# Patient Record
Sex: Female | Born: 1965 | Race: White | Hispanic: No | Marital: Married | Smoking: Never smoker
Health system: Southern US, Community
[De-identification: ages and names within clinical notes are randomized; demographics above are authoritative.]

## PROBLEM LIST (undated history)

## (undated) DIAGNOSIS — I219 Acute myocardial infarction, unspecified: Secondary | ICD-10-CM

## (undated) DIAGNOSIS — R0600 Dyspnea, unspecified: Secondary | ICD-10-CM

## (undated) DIAGNOSIS — A812 Progressive multifocal leukoencephalopathy: Secondary | ICD-10-CM

## (undated) DIAGNOSIS — G40209 Localization-related (focal) (partial) symptomatic epilepsy and epileptic syndromes with complex partial seizures, not intractable, without status epilepticus: Secondary | ICD-10-CM

## (undated) DIAGNOSIS — G709 Myoneural disorder, unspecified: Secondary | ICD-10-CM

## (undated) DIAGNOSIS — G43909 Migraine, unspecified, not intractable, without status migrainosus: Secondary | ICD-10-CM

## (undated) DIAGNOSIS — D169 Benign neoplasm of bone and articular cartilage, unspecified: Secondary | ICD-10-CM

## (undated) DIAGNOSIS — E559 Vitamin D deficiency, unspecified: Secondary | ICD-10-CM

## (undated) DIAGNOSIS — G4730 Sleep apnea, unspecified: Secondary | ICD-10-CM

## (undated) DIAGNOSIS — I10 Essential (primary) hypertension: Secondary | ICD-10-CM

## (undated) DIAGNOSIS — E611 Iron deficiency: Secondary | ICD-10-CM

## (undated) DIAGNOSIS — I639 Cerebral infarction, unspecified: Secondary | ICD-10-CM

## (undated) DIAGNOSIS — R0609 Other forms of dyspnea: Secondary | ICD-10-CM

## (undated) DIAGNOSIS — J449 Chronic obstructive pulmonary disease, unspecified: Secondary | ICD-10-CM

## (undated) DIAGNOSIS — IMO0001 Reserved for inherently not codable concepts without codable children: Secondary | ICD-10-CM

## (undated) DIAGNOSIS — E519 Thiamine deficiency, unspecified: Secondary | ICD-10-CM

## (undated) DIAGNOSIS — J45909 Unspecified asthma, uncomplicated: Secondary | ICD-10-CM

## (undated) DIAGNOSIS — R011 Cardiac murmur, unspecified: Secondary | ICD-10-CM

## (undated) HISTORY — DX: Thiamine deficiency, unspecified: E51.9

## (undated) HISTORY — DX: Dyspnea, unspecified: R06.00

## (undated) HISTORY — DX: Unspecified asthma, uncomplicated: J45.909

## (undated) HISTORY — PX: MUSCLE BIOPSY: SHX716

## (undated) HISTORY — PX: LOOP RECORDER IMPLANT: SHX5954

## (undated) HISTORY — DX: Other forms of dyspnea: R06.09

## (undated) HISTORY — DX: Chronic obstructive pulmonary disease, unspecified: J44.9

## (undated) HISTORY — DX: Cardiac murmur, unspecified: R01.1

## (undated) HISTORY — DX: Essential (primary) hypertension: I10

## (undated) HISTORY — PX: CRANIOTOMY: SHX93

## (undated) HISTORY — DX: Vitamin D deficiency, unspecified: E55.9

## (undated) HISTORY — DX: Progressive multifocal leukoencephalopathy: A81.2

## (undated) HISTORY — DX: Sleep apnea, unspecified: G47.30

## (undated) HISTORY — PX: OTHER SURGICAL HISTORY: SHX169

## (undated) HISTORY — DX: Cerebral infarction, unspecified: I63.9

## (undated) HISTORY — PX: OSTEOTOMY: SHX137

## (undated) HISTORY — PX: TUBAL LIGATION: SHX77

## (undated) HISTORY — DX: Myoneural disorder, unspecified: G70.9

## (undated) HISTORY — DX: Localization-related (focal) (partial) symptomatic epilepsy and epileptic syndromes with complex partial seizures, not intractable, without status epilepticus: G40.209

---

## 2000-11-26 ENCOUNTER — Other Ambulatory Visit: Admission: RE | Admit: 2000-11-26 | Discharge: 2000-11-26 | Payer: Self-pay | Admitting: Family Medicine

## 2002-02-16 ENCOUNTER — Emergency Department (HOSPITAL_COMMUNITY): Admission: EM | Admit: 2002-02-16 | Discharge: 2002-02-16 | Payer: Self-pay | Admitting: *Deleted

## 2002-02-18 ENCOUNTER — Emergency Department (HOSPITAL_COMMUNITY): Admission: EM | Admit: 2002-02-18 | Discharge: 2002-02-18 | Payer: Self-pay | Admitting: Emergency Medicine

## 2012-10-17 ENCOUNTER — Telehealth: Payer: Self-pay | Admitting: Nurse Practitioner

## 2012-10-17 NOTE — Telephone Encounter (Signed)
APPT MADE

## 2012-10-19 ENCOUNTER — Encounter: Payer: Self-pay | Admitting: General Practice

## 2012-10-19 ENCOUNTER — Ambulatory Visit (INDEPENDENT_AMBULATORY_CARE_PROVIDER_SITE_OTHER): Payer: PRIVATE HEALTH INSURANCE | Admitting: General Practice

## 2012-10-19 VITALS — BP 143/92 | HR 75 | Temp 98.6°F | Ht 67.0 in | Wt 142.0 lb

## 2012-10-19 DIAGNOSIS — N6320 Unspecified lump in the left breast, unspecified quadrant: Secondary | ICD-10-CM

## 2012-10-19 DIAGNOSIS — N63 Unspecified lump in unspecified breast: Secondary | ICD-10-CM

## 2012-10-19 NOTE — Patient Instructions (Addendum)
Mammogram Tips  Healthy women should begin getting mammograms every year or two once they reach age 47, and once a year when they reach age 50. Here are tips:  · Find an experienced, high-volume center with accomplished radiologists. You can ask for their credentials.  · Ask to see the certificate showing the center is approved by the U.S. Food and Drug Administration.  · Use the same center regularly, so it is easier to compare your new mammograms with your old ones.  · Bring a list of places you have had mammograms, dates, biopsies or other breast treatments. Bring old mammograms with you or have them sent to your primary caregiver.  · Describe any breast problems to your caregiver or the person doing the mammogram. Be ready to give past surgeries, birth control pills, hormone use, breast implants, growths, moles, breast scars and family or personal history of breast cancer.  · Call your doctor or center to check on the mammogram if you hear nothing within 10 days. Do not assume everything was normal.  · To protect your privacy, the mammogram results cannot be given over the phone or to anyone but you.  · Radiation from a mammogram is very low and does not pose a radiation risk.  · Mammograms can detect breast problems other than breast cancer.  · You may be asked stand or sit in front of the X-ray machine.  · Two small plastic or glass plates are placed around the breast when taking the X-ray.  · If you are menstruating, schedule your mammogram a week after your menstrual period.  · Do not wear deodorants, powder or perfume when getting a mammogram.  · Wash your breasts and under your arms before getting a mammogram.  · Wear cloths that are easy for you to undress and dress.  · Arrive at the center at least 15 minutes before the mammogram is scheduled.  · There may be slight discomfort during the mammogram, but it goes away shortly after the test.  · Try to relax as much as possible during the mammogram.  · Talk  to your caregiver if you do not understand the results of the mammogram.  · Follow the recommendations of your caregiver regarding further tests and treatments if needed.  · Get a second opinion if you are concerned or question the results of the mammogram, further tests or treatment if needed.  · Continue with monthly self-breast exams and yearly caregiver exams even if the mammogram is normal.  · Your caregiver may recommend getting a mammogram before age 47 and more often if you are at high risk for developing breast cancer.  Document Released: 10/22/2005 Document Revised: 09/28/2011 Document Reviewed: 06/29/2008  [employer]® Patient Information ©2013 [employer].

## 2012-10-19 NOTE — Progress Notes (Signed)
  Subjective:    Patient ID: Deborah Meyer, female    DOB: 02-Mar-1966, 47 y.o.   MRN: [id number]  HPI Presents with left breast lump, which was detected by patient last week. Reports finding one lump in the past and it was removed. Reports lump is firm, tender, and denies drainage. Reports it to be about size of quarter. Reports having a lump removed from same breast 4 years ago. Denies having yearly mammograms, last mammogram more than 2 years ago.     Review of Systems  Constitutional: Negative for chills and unexpected weight change.  HENT: Negative for neck pain.        Left neck nodule   Respiratory: Negative for chest tightness and shortness of breath.   Cardiovascular: Negative for chest pain and palpitations.  Genitourinary: Negative for difficulty urinating.  Skin: Negative.  Negative for rash.  Neurological: Negative for dizziness and headaches.  Psychiatric/Behavioral: Negative.        Objective:   Physical Exam  Constitutional: She is oriented to person, place, and time. She appears well-developed and well-nourished.  Neck: Normal range of motion. No thyromegaly present.  lleft anterior cervical chain edema, tenderness  Cardiovascular: Normal rate, regular rhythm and normal heart sounds.   No murmur heard. Pulmonary/Chest: Effort normal and breath sounds normal. No respiratory distress. She exhibits no tenderness and no edema. Right breast exhibits no inverted nipple, no mass, no nipple discharge, no skin change and no tenderness. Left breast exhibits mass. Left breast exhibits no inverted nipple, no nipple discharge, no skin change and no tenderness. Breasts are symmetrical.  Left upper outer breast has non-tender lump, about size of quarter, slightly mobility.  Shape, size, symmetry, and color of bilateral breast are equal.   Lymphadenopathy:    She has cervical adenopathy.  Neurological: She is alert and oriented to person, place, and time.  Skin: Skin is warm and  dry. No rash noted.         Assessment & Plan:  Referral to [facility] for diagnostic mammogram  Continue self breast examinations Discussed importance of yearly mammograms RTO if symptoms worsen prior to referral  Patient verbalized understanding and denies further questions  Raymon Mutton, FNP-C

## 2012-10-25 ENCOUNTER — Encounter: Payer: Self-pay | Admitting: General Practice

## 2012-10-26 ENCOUNTER — Telehealth: Payer: Self-pay

## 2012-10-26 ENCOUNTER — Telehealth: Payer: Self-pay | Admitting: General Practice

## 2012-10-26 DIAGNOSIS — N63 Unspecified lump in unspecified breast: Secondary | ICD-10-CM

## 2012-10-26 NOTE — Telephone Encounter (Signed)
Attempted to contact patient on all 3 contact numbers listed. Left message for patient to call office.

## 2012-10-27 ENCOUNTER — Telehealth: Payer: Self-pay | Admitting: General Practice

## 2012-10-27 NOTE — Telephone Encounter (Signed)
Has no prefrence just someone around the [place]

## 2012-10-28 ENCOUNTER — Other Ambulatory Visit: Payer: Self-pay | Admitting: *Deleted

## 2012-10-28 DIAGNOSIS — N63 Unspecified lump in unspecified breast: Secondary | ICD-10-CM

## 2012-10-28 NOTE — Progress Notes (Signed)
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  Radiologist suggested 6 month f/u with L breast ultrasound.  Referral made and will be deferred for 6 months.

## 2012-10-31 ENCOUNTER — Other Ambulatory Visit: Payer: Self-pay

## 2012-10-31 ENCOUNTER — Encounter: Payer: Self-pay | Admitting: Family Medicine

## 2012-10-31 ENCOUNTER — Telehealth: Payer: Self-pay

## 2012-10-31 DIAGNOSIS — N6320 Unspecified lump in the left breast, unspecified quadrant: Secondary | ICD-10-CM

## 2012-10-31 NOTE — Telephone Encounter (Signed)
Patient calling to see when surgeon is scheduled to remove left breast lump    There is no referral for a surgeon in workqueue. Please put a referral in for surgeon  Thanks

## 2012-11-01 NOTE — Telephone Encounter (Signed)
Referral request made

## 2012-11-01 NOTE — Telephone Encounter (Signed)
REFERRAL MADE

## 2012-11-01 NOTE — Telephone Encounter (Signed)
Referral order placed today.

## 2013-05-18 ENCOUNTER — Telehealth: Payer: Self-pay | Admitting: *Deleted

## 2013-05-25 ENCOUNTER — Other Ambulatory Visit: Payer: Self-pay

## 2013-06-19 NOTE — Telephone Encounter (Signed)
taken care of-

## 2014-01-25 ENCOUNTER — Other Ambulatory Visit: Payer: Self-pay

## 2014-02-05 ENCOUNTER — Encounter: Payer: Self-pay | Admitting: Family Medicine

## 2014-02-05 ENCOUNTER — Ambulatory Visit (INDEPENDENT_AMBULATORY_CARE_PROVIDER_SITE_OTHER): Payer: PRIVATE HEALTH INSURANCE | Admitting: Family Medicine

## 2014-02-05 VITALS — BP 161/89 | HR 88 | Temp 99.0°F | Ht 67.0 in | Wt 154.0 lb

## 2014-02-05 DIAGNOSIS — I10 Essential (primary) hypertension: Secondary | ICD-10-CM | POA: Insufficient documentation

## 2014-02-05 DIAGNOSIS — R011 Cardiac murmur, unspecified: Secondary | ICD-10-CM | POA: Insufficient documentation

## 2014-02-05 DIAGNOSIS — R0789 Other chest pain: Secondary | ICD-10-CM

## 2014-02-05 DIAGNOSIS — J449 Chronic obstructive pulmonary disease, unspecified: Secondary | ICD-10-CM | POA: Insufficient documentation

## 2014-02-05 DIAGNOSIS — G709 Myoneural disorder, unspecified: Secondary | ICD-10-CM | POA: Insufficient documentation

## 2014-02-05 LAB — POCT URINALYSIS DIPSTICK
Bilirubin, UA: NEGATIVE
Glucose, UA: NEGATIVE
KETONES UA: NEGATIVE
Nitrite, UA: NEGATIVE
PH UA: 5
SPEC GRAV UA: 1.025
UROBILINOGEN UA: NEGATIVE

## 2014-02-05 LAB — POCT UA - MICROSCOPIC ONLY
CASTS, UR, LPF, POC: NEGATIVE
Crystals, Ur, HPF, POC: NEGATIVE
Mucus, UA: NEGATIVE
Yeast, UA: NEGATIVE

## 2014-02-05 MED ORDER — NADOLOL 40 MG PO TABS: 40.0000 mg | ORAL_TABLET | Freq: Every day | ORAL | Status: DC

## 2014-02-05 NOTE — Progress Notes (Signed)
   Subjective:    Patient ID: Deborah Meyer, female    DOB: 11-13-1965, 48 y.o.   MRN: [id number]  Hypertension This is a chronic problem. The current episode started 1 to 4 weeks ago. The problem is unchanged. The problem is uncontrolled. Associated symptoms include chest pain (At times related to elevated BP). There are no associated agents to hypertension. Past treatments include angiotensin blockers and beta blockers. The current treatment provides no improvement. There are no compliance problems.  Hypertensive end-organ damage includes CAD/MI (possible per EKG).      Review of Systems  Constitutional: Negative.   HENT: Negative.   Eyes: Negative.   Respiratory: Negative.   Cardiovascular: Positive for chest pain (At times related to elevated BP).  Gastrointestinal: Negative.   Genitourinary: Negative.   Neurological: Negative.   Psychiatric/Behavioral: Negative.        Objective:   Physical Exam  Constitutional: She is oriented to person, place, and time. She appears well-developed and well-nourished.  Eyes: Conjunctivae and EOM are normal.  Neck: Normal range of motion. Neck supple.  Cardiovascular: Normal rate, regular rhythm and normal heart sounds.   Pulmonary/Chest: Effort normal and breath sounds normal.  Abdominal: Soft. Bowel sounds are normal.  No bruit heard  Musculoskeletal: Normal range of motion.  Neurological: She is alert and oriented to person, place, and time. She has normal reflexes.  Skin: Skin is warm and dry.  Psychiatric: She has a normal mood and affect. Her behavior is normal. Thought content normal.          Assessment & Plan:  1. Essential hypertension Trying to ID reason why BP has gone up For now increase nadolol from 20 to 40 mg - POCT urinalysis dipstick - EKG 12-Lead - BMP8+EGFR 2. EKG is not normal, exhibiting poor R wave progression.  She sees a cardiologist already; will get her back to see him

## 2014-02-06 ENCOUNTER — Telehealth: Payer: Self-pay | Admitting: Family Medicine

## 2014-02-06 LAB — BMP8+EGFR
BUN / CREAT RATIO: 22 (ref 9–23)
BUN: 15 mg/dL (ref 6–24)
CHLORIDE: 104 mmol/L (ref 97–108)
CO2: 24 mmol/L (ref 18–29)
Calcium: 9.2 mg/dL (ref 8.7–10.2)
Creatinine, Ser: 0.68 mg/dL (ref 0.57–1.00)
GFR, EST AFRICAN AMERICAN: 120 mL/min/{1.73_m2} (ref >59)
GFR, EST NON AFRICAN AMERICAN: 104 mL/min/{1.73_m2} (ref >59)
Glucose: 86 mg/dL (ref 65–99)
POTASSIUM: 3.9 mmol/L (ref 3.5–5.2)
SODIUM: 140 mmol/L (ref 134–144)

## 2014-02-06 NOTE — Telephone Encounter (Signed)
Patient aware of labs and checking on referral it says letter sent?

## 2014-02-17 DIAGNOSIS — IMO0001 Reserved for inherently not codable concepts without codable children: Secondary | ICD-10-CM

## 2014-02-17 HISTORY — DX: Reserved for inherently not codable concepts without codable children: IMO0001

## 2014-02-21 ENCOUNTER — Telehealth: Payer: Self-pay | Admitting: Family Medicine

## 2014-02-21 NOTE — Telephone Encounter (Signed)
Did you call on referral?

## 2014-03-05 NOTE — Progress Notes (Signed)
Just a  FYI: We have tried to call the patient to come in for a urine culture and have been unable to reach after serial tries

## 2014-05-04 ENCOUNTER — Other Ambulatory Visit: Payer: Self-pay

## 2014-05-25 ENCOUNTER — Observation Stay (HOSPITAL_COMMUNITY)
Admission: EM | Admit: 2014-05-25 | Discharge: 2014-05-26 | Disposition: A | Discharge status: Home / Self Care | Payer: PRIVATE HEALTH INSURANCE | Attending: Internal Medicine | Admitting: Internal Medicine

## 2014-05-25 ENCOUNTER — Emergency Department (HOSPITAL_COMMUNITY): Payer: PRIVATE HEALTH INSURANCE

## 2014-05-25 ENCOUNTER — Encounter (HOSPITAL_COMMUNITY): Payer: Self-pay

## 2014-05-25 DIAGNOSIS — R200 Anesthesia of skin: Secondary | ICD-10-CM | POA: Diagnosis not present

## 2014-05-25 DIAGNOSIS — Z79899 Other long term (current) drug therapy: Secondary | ICD-10-CM | POA: Insufficient documentation

## 2014-05-25 DIAGNOSIS — I4581 Long QT syndrome: Secondary | ICD-10-CM | POA: Insufficient documentation

## 2014-05-25 DIAGNOSIS — G709 Myoneural disorder, unspecified: Secondary | ICD-10-CM | POA: Diagnosis not present

## 2014-05-25 DIAGNOSIS — Z880 Allergy status to penicillin: Secondary | ICD-10-CM | POA: Diagnosis not present

## 2014-05-25 DIAGNOSIS — Z7951 Long term (current) use of inhaled steroids: Secondary | ICD-10-CM | POA: Insufficient documentation

## 2014-05-25 DIAGNOSIS — W19XXXA Unspecified fall, initial encounter: Secondary | ICD-10-CM | POA: Insufficient documentation

## 2014-05-25 DIAGNOSIS — Y9289 Other specified places as the place of occurrence of the external cause: Secondary | ICD-10-CM | POA: Insufficient documentation

## 2014-05-25 DIAGNOSIS — R011 Cardiac murmur, unspecified: Secondary | ICD-10-CM | POA: Insufficient documentation

## 2014-05-25 DIAGNOSIS — S0990XA Unspecified injury of head, initial encounter: Secondary | ICD-10-CM | POA: Diagnosis not present

## 2014-05-25 DIAGNOSIS — W01198A Fall on same level from slipping, tripping and stumbling with subsequent striking against other object, initial encounter: Secondary | ICD-10-CM | POA: Diagnosis not present

## 2014-05-25 DIAGNOSIS — Y9389 Activity, other specified: Secondary | ICD-10-CM | POA: Diagnosis not present

## 2014-05-25 DIAGNOSIS — R55 Syncope and collapse: Principal | ICD-10-CM

## 2014-05-25 DIAGNOSIS — R4020 Unspecified coma: Secondary | ICD-10-CM | POA: Diagnosis present

## 2014-05-25 DIAGNOSIS — R112 Nausea with vomiting, unspecified: Secondary | ICD-10-CM | POA: Insufficient documentation

## 2014-05-25 DIAGNOSIS — I10 Essential (primary) hypertension: Secondary | ICD-10-CM | POA: Diagnosis not present

## 2014-05-25 DIAGNOSIS — I213 ST elevation (STEMI) myocardial infarction of unspecified site: Secondary | ICD-10-CM | POA: Insufficient documentation

## 2014-05-25 DIAGNOSIS — Z7952 Long term (current) use of systemic steroids: Secondary | ICD-10-CM | POA: Insufficient documentation

## 2014-05-25 DIAGNOSIS — R9431 Abnormal electrocardiogram [ECG] [EKG]: Secondary | ICD-10-CM | POA: Diagnosis present

## 2014-05-25 DIAGNOSIS — J45909 Unspecified asthma, uncomplicated: Secondary | ICD-10-CM | POA: Diagnosis not present

## 2014-05-25 DIAGNOSIS — E876 Hypokalemia: Secondary | ICD-10-CM | POA: Diagnosis present

## 2014-05-25 HISTORY — DX: Benign neoplasm of bone and articular cartilage, unspecified: D16.9

## 2014-05-25 HISTORY — DX: Acute myocardial infarction, unspecified: I21.9

## 2014-05-25 LAB — CBC WITH DIFFERENTIAL/PLATELET
Basophils Absolute: 0 10*3/uL (ref 0.0–0.1)
Basophils Relative: 1 % (ref 0–1)
EOS ABS: 0.1 10*3/uL (ref 0.0–0.7)
Eosinophils Relative: 1 % (ref 0–5)
HCT: 38.1 % (ref 36.0–46.0)
HEMOGLOBIN: 12.6 g/dL (ref 12.0–15.0)
LYMPHS ABS: 1.1 10*3/uL (ref 0.7–4.0)
Lymphocytes Relative: 21 % (ref 12–46)
MCH: 30.2 pg (ref 26.0–34.0)
MCHC: 33.1 g/dL (ref 30.0–36.0)
MCV: 91.4 fL (ref 78.0–100.0)
MONO ABS: 0.5 10*3/uL (ref 0.1–1.0)
MONOS PCT: 9 % (ref 3–12)
NEUTROS PCT: 68 % (ref 43–77)
Neutro Abs: 3.7 10*3/uL (ref 1.7–7.7)
Platelets: 184 10*3/uL (ref 150–400)
RBC: 4.17 MIL/uL (ref 3.87–5.11)
RDW: 13.4 % (ref 11.5–15.5)
WBC: 5.4 10*3/uL (ref 4.0–10.5)

## 2014-05-25 LAB — BASIC METABOLIC PANEL
Anion gap: 12 (ref 5–15)
BUN: 14 mg/dL (ref 6–23)
CO2: 24 mEq/L (ref 19–32)
CREATININE: 0.7 mg/dL (ref 0.50–1.10)
Calcium: 8.8 mg/dL (ref 8.4–10.5)
Chloride: 105 mEq/L (ref 96–112)
GFR calc Af Amer: >90 mL/min (ref >90)
GFR calc non Af Amer: >90 mL/min (ref >90)
GLUCOSE: 111 mg/dL — AB (ref 70–99)
POTASSIUM: 3.4 meq/L — AB (ref 3.7–5.3)
Sodium: 141 mEq/L (ref 137–147)

## 2014-05-25 LAB — URINALYSIS, ROUTINE W REFLEX MICROSCOPIC
BILIRUBIN URINE: NEGATIVE
Glucose, UA: NEGATIVE mg/dL
Ketones, ur: NEGATIVE mg/dL
Nitrite: NEGATIVE
Protein, ur: NEGATIVE mg/dL
SPECIFIC GRAVITY, URINE: 1.02 (ref 1.005–1.030)
Urobilinogen, UA: 0.2 mg/dL (ref 0.0–1.0)
pH: 6.5 (ref 5.0–8.0)

## 2014-05-25 LAB — URINE MICROSCOPIC-ADD ON

## 2014-05-25 LAB — PREGNANCY, URINE: PREG TEST UR: NEGATIVE

## 2014-05-25 LAB — CBG MONITORING, ED: GLUCOSE-CAPILLARY: 101 mg/dL — AB (ref 70–99)

## 2014-05-25 LAB — TROPONIN I: Troponin I: <0.3 ng/mL (ref <0.30)

## 2014-05-25 LAB — MAGNESIUM: Magnesium: 2.1 mg/dL (ref 1.5–2.5)

## 2014-05-25 MED ORDER — ALBUTEROL SULFATE (2.5 MG/3ML) 0.083% IN NEBU: 3.0000 mL | INHALATION_SOLUTION | Freq: Four times a day (QID) | RESPIRATORY_TRACT | Status: DC | PRN

## 2014-05-25 MED ORDER — POTASSIUM CHLORIDE CRYS ER 20 MEQ PO TBCR
40.0000 meq | EXTENDED_RELEASE_TABLET | Freq: Once | ORAL | Status: AC
Start: 2014-05-25 — End: 2014-05-25
  Administered 2014-05-25: 40 meq via ORAL
  Filled 2014-05-25: qty 2

## 2014-05-25 MED ORDER — ENOXAPARIN SODIUM 40 MG/0.4ML ~~LOC~~ SOLN
40.0000 mg | SUBCUTANEOUS | Status: DC
  Administered 2014-05-25: 40 mg via SUBCUTANEOUS
  Filled 2014-05-25: qty 0.4

## 2014-05-25 MED ORDER — MONTELUKAST SODIUM 10 MG PO TABS
10.0000 mg | ORAL_TABLET | Freq: Every day | ORAL | Status: DC
  Administered 2014-05-25: 10 mg via ORAL
  Filled 2014-05-25: qty 1

## 2014-05-25 MED ORDER — ALUM & MAG HYDROXIDE-SIMETH 200-200-20 MG/5ML PO SUSP: 30.0000 mL | Freq: Four times a day (QID) | ORAL | Status: DC | PRN

## 2014-05-25 MED ORDER — HYDROCODONE-ACETAMINOPHEN 5-325 MG PO TABS
1.0000 | ORAL_TABLET | Freq: Four times a day (QID) | ORAL | Status: DC | PRN
  Administered 2014-05-25: 1 via ORAL
  Filled 2014-05-25: qty 1

## 2014-05-25 MED ORDER — MOMETASONE FURO-FORMOTEROL FUM 100-5 MCG/ACT IN AERO
2.0000 | INHALATION_SPRAY | Freq: Two times a day (BID) | RESPIRATORY_TRACT | Status: DC
  Administered 2014-05-25 – 2014-05-26 (×2): 2 via RESPIRATORY_TRACT
  Filled 2014-05-25: qty 8.8

## 2014-05-25 MED ORDER — ONDANSETRON HCL 4 MG PO TABS: 4.0000 mg | ORAL_TABLET | Freq: Four times a day (QID) | ORAL | Status: DC | PRN

## 2014-05-25 MED ORDER — ONDANSETRON HCL 4 MG/2ML IJ SOLN
4.0000 mg | Freq: Once | INTRAMUSCULAR | Status: AC
  Administered 2014-05-25: 4 mg via INTRAVENOUS
  Filled 2014-05-25: qty 2

## 2014-05-25 MED ORDER — AMLODIPINE BESYLATE 5 MG PO TABS
10.0000 mg | ORAL_TABLET | Freq: Every day | ORAL | Status: DC
  Administered 2014-05-25 – 2014-05-26 (×2): 10 mg via ORAL
  Filled 2014-05-25 (×2): qty 2

## 2014-05-25 MED ORDER — SODIUM CHLORIDE 0.9 % IV BOLUS (SEPSIS)
1000.0000 mL | Freq: Once | INTRAVENOUS | Status: AC
  Administered 2014-05-25: 1000 mL via INTRAVENOUS

## 2014-05-25 MED ORDER — ACETAMINOPHEN 325 MG PO TABS
650.0000 mg | ORAL_TABLET | Freq: Four times a day (QID) | ORAL | Status: DC | PRN
  Administered 2014-05-26: 650 mg via ORAL
  Filled 2014-05-25: qty 2

## 2014-05-25 MED ORDER — SODIUM CHLORIDE 0.9 % IV SOLN: 250.0000 mL | INTRAVENOUS | Status: DC | PRN

## 2014-05-25 MED ORDER — ONDANSETRON HCL 4 MG/2ML IJ SOLN: 4.0000 mg | Freq: Four times a day (QID) | INTRAMUSCULAR | Status: DC | PRN

## 2014-05-25 MED ORDER — ACETAMINOPHEN 650 MG RE SUPP: 650.0000 mg | Freq: Four times a day (QID) | RECTAL | Status: DC | PRN

## 2014-05-25 MED ORDER — BISACODYL 10 MG RE SUPP: 10.0000 mg | Freq: Every day | RECTAL | Status: DC | PRN

## 2014-05-25 MED ORDER — CLONAZEPAM 0.5 MG PO TABS: 1.0000 mg | ORAL_TABLET | Freq: Two times a day (BID) | ORAL | Status: DC | PRN

## 2014-05-25 MED ORDER — SODIUM CHLORIDE 0.9 % IJ SOLN: 3.0000 mL | INTRAMUSCULAR | Status: DC | PRN

## 2014-05-25 MED ORDER — SODIUM CHLORIDE 0.9 % IJ SOLN
3.0000 mL | Freq: Two times a day (BID) | INTRAMUSCULAR | Status: DC
  Administered 2014-05-25 (×2): 3 mL via INTRAVENOUS

## 2014-05-25 MED ORDER — LISINOPRIL 10 MG PO TABS
10.0000 mg | ORAL_TABLET | Freq: Every day | ORAL | Status: DC
  Administered 2014-05-25 – 2014-05-26 (×2): 10 mg via ORAL
  Filled 2014-05-25 (×2): qty 1

## 2014-05-25 MED ORDER — SODIUM CHLORIDE 0.9 % IJ SOLN: 3.0000 mL | Freq: Two times a day (BID) | INTRAMUSCULAR | Status: DC

## 2014-05-25 MED ORDER — SODIUM CHLORIDE 0.9 % IJ SOLN
INTRAMUSCULAR | Status: AC
  Filled 2014-05-25: qty 30

## 2014-05-25 NOTE — ED Provider Notes (Signed)
Patient had syncopal event this morning. She felt lightheaded immediately prior to the event. She fell striking her head. Presently complains of mild headache and "numbness" at the right side of her face no other associated symptoms. On exam patient alert Glasgow Coma Score 15 HEENT exam no facial asymmetry neck supple no bruit lungs clear auscultation heart regular rate and rhythm abdomen nontender. Service of edema neurologic Glasgow Coma Score 15 cranial nerves II through XII grossly intact pronator drift normal finger to nose normal heel-to-shin normal DTR symmetric bilaterally knee jerk ankle jerk and biceps toes downward going bilaterally. MRI brainordered due to patient striking her head and also abnormal feeling in the right side of her face. Patient has long QT interval on EKG and no old EKG for comparison. I don't have obvious etiology of her syncope, therefore suggests inpatient observation  Orlie Dakin, MD 05/25/14 [phone]

## 2014-05-25 NOTE — ED Provider Notes (Signed)
CSN: [id number]     Arrival date & time 05/25/14  0751 History   First MD Initiated Contact with Patient 05/25/14 [phone]     Chief Complaint  Patient presents with  . Loss of Consciousness     (Consider location/radiation/quality/duration/timing/severity/associated sxs/prior Treatment) HPI  Past Medical History  Diagnosis Date  . Hypertension   . Asthma   . Neuromuscular disorder   . Heart murmur   . Heart attack    Past Surgical History  Procedure Laterality Date  . Tubal ligation    . Osteotomy    . Muscle biopsy    . Craniotomy    . Breast tumors removed     Family History  Problem Relation Age of Onset  . Diabetes Mother   . Hypertension Mother   . Stroke Mother   . Heart disease Mother   . Heart disease Father   . Stroke Father   . Depression Father   . Alzheimer's disease Father   . Diabetes Sister   . Diabetes Brother   . Hyperlipidemia Brother    History  Substance Use Topics  . Smoking status: Never Smoker   . Smokeless tobacco: Not on file  . Alcohol Use: No   OB History    No data available     Review of Systems    Allergies  Mushroom extract complex; Dairy aid; Lyrica; Morphine and related; Penicillins; Toradol; Asa; Prednisone; and Tramadol  Home Medications   Prior to Admission medications   Medication Sig Start Date End Date Taking? Authorizing Provider  albuterol (PROVENTIL HFA;VENTOLIN HFA) 108 (90 BASE) MCG/ACT inhaler Inhale 2 puffs into the lungs every 6 (six) hours as needed for wheezing.    Historical Provider, MD  aliskiren (TEKTURNA) 150 MG tablet Take 150 mg by mouth daily.    Historical Provider, MD  Armodafinil (NUVIGIL) 250 MG tablet Take 250 mg by mouth daily.    Historical Provider, MD  clonazePAM (KLONOPIN) 1 MG tablet Take 1 mg by mouth 2 (two) times daily as needed for anxiety.    Historical Provider, MD  cyanocobalamin (,VITAMIN B-12,) 1000 MCG/ML injection Inject 1,000 mcg into the muscle once a week.    Historical  Provider, MD  fluticasone-salmeterol (ADVAIR HFA) 115-21 MCG/ACT inhaler Inhale 2 puffs into the lungs 2 (two) times daily.    Historical Provider, MD  hydrochlorothiazide (HYDRODIURIL) 25 MG tablet Take 25 mg by mouth daily.    Historical Provider, MD  HYDROcodone-acetaminophen (NORCO/VICODIN) 5-325 MG per tablet Take 1 tablet by mouth every 6 (six) hours as needed for pain.    Historical Provider, MD  methylPREDNISolone acetate (DEPO-MEDROL) 20 MG/ML SUSP injection Inject 10 mg into the muscle every 3 (three) days.    Historical Provider, MD  mometasone (NASONEX) 50 MCG/ACT nasal spray Place 2 sprays into the nose daily.    Historical Provider, MD  montelukast (SINGULAIR) 10 MG tablet Take 10 mg by mouth at bedtime.    Historical Provider, MD  nadolol (CORGARD) 40 MG tablet Take 1 tablet (40 mg total) by mouth daily. 02/05/14   Wardell Honour, MD   BP 117/70 mmHg  Pulse 69  Temp(Src) 97.5 F (36.4 C) (Oral)  Resp 16  Ht 5\' 8"  (1.727 m)  Wt 145 lb (65.772 kg)  BMI 22.05 kg/m2  SpO2 100%  LMP 05/23/2014 Physical Exam  ED Course  Procedures (including critical care time) Labs Review Labs Reviewed  CBC WITH DIFFERENTIAL  BASIC METABOLIC PANEL  URINALYSIS, ROUTINE W  REFLEX MICROSCOPIC  PREGNANCY, URINE  CBG MONITORING, ED    Imaging Review No results found.   EKG Interpretation None      MDM   Final diagnoses:  Syncope    Please delete. Duplicate note    Orlie Dakin, MD 05/25/14 [phone]

## 2014-05-25 NOTE — ED Notes (Signed)
Pt reports was at work this morning around 0600 and passed out.  Reports fell and struck head on the floor.  Says when she "came to" she vomited.  C/o headache and nausea.

## 2014-05-25 NOTE — ED Provider Notes (Signed)
CSN: [id number]     Arrival date & time 05/25/14  0751 History   First MD Initiated Contact with Patient 05/25/14 [phone]     Chief Complaint  Patient presents with  . Loss of Consciousness     (Consider location/radiation/quality/duration/timing/severity/associated sxs/prior Treatment) HPI  Deborah Meyer is a 48 y.o. 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 Patient states that she was walking down hall at her place of employment, and had a sudden onset of feeling lightheaded and faint. She states that she sat down in the floor and then passed out striking her right side of her head onto the floor. She reports approximately 4-5 episodes of vomiting after the event with right-sided headache with numbness and tightness to the right side of her face. 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 She states that her blood pressure was low which is unusual for her.  She now states that she feels better except for continued tightness and numbness to her right face and right-sided headache. She denies recent illness, fever, visual changes, chest pain, shortness of breath, numbness or weakness of her upper or lower extremities.   Past Medical History  Diagnosis Date  . Hypertension   . Asthma   . Neuromuscular disorder   . Heart murmur   . Heart attack    Past Surgical History  Procedure Laterality Date  . Tubal ligation    . Osteotomy    . Muscle biopsy    . Craniotomy    . Breast tumors removed     Family History  Problem Relation Age of Onset  . Diabetes Mother   . Hypertension Mother   . Stroke Mother   . Heart disease Mother   . Heart disease Father   . Stroke Father   . Depression Father   . Alzheimer's disease Father   . Diabetes Sister   . Diabetes Brother   . Hyperlipidemia  Brother    History  Substance Use Topics  . Smoking status: Never Smoker   . Smokeless tobacco: Not on file  . Alcohol Use: No   OB History    No data available     Review of Systems  Constitutional: Negative for fever, chills, activity change and appetite change.  HENT: Negative for congestion, facial swelling, hearing loss, trouble swallowing and voice change.        "tightness" numbness and tingling right face  Eyes: Negative for pain and visual disturbance.  Respiratory: Negative for chest tightness and shortness of breath.   Cardiovascular: Negative for chest pain and palpitations.  Gastrointestinal: Positive for nausea and vomiting. Negative for abdominal pain and diarrhea.  Genitourinary: Negative for dysuria.  Musculoskeletal: Negative for back pain, neck pain and neck stiffness.  Skin: Negative for rash.  Neurological: Positive for syncope, light-headedness, numbness and headaches. Negative for dizziness, seizures, facial asymmetry, speech difficulty and weakness.  Psychiatric/Behavioral: Negative for confusion. The patient is not hyperactive.   All other systems reviewed and are negative.     Allergies  Mushroom extract complex; Dairy aid; Lyrica; Morphine and related; Penicillins; Toradol; Asa; Prednisone; and Tramadol  Home Medications   Prior to Admission medications   Medication Sig Start Date End Date Taking? Authorizing Provider  albuterol (PROVENTIL HFA;VENTOLIN HFA) 108 (90 BASE) MCG/ACT inhaler Inhale 2 puffs  into the lungs every 6 (six) hours as needed for wheezing.    Historical Provider, MD  aliskiren (TEKTURNA) 150 MG tablet Take 150 mg by mouth daily.    Historical Provider, MD  Armodafinil (NUVIGIL) 250 MG tablet Take 250 mg by mouth daily.    Historical Provider, MD  clonazePAM (KLONOPIN) 1 MG tablet Take 1 mg by mouth 2 (two) times daily as needed for anxiety.    Historical Provider, MD  cyanocobalamin (,VITAMIN B-12,) 1000 MCG/ML injection Inject  1,000 mcg into the muscle once a week.    Historical Provider, MD  fluticasone-salmeterol (ADVAIR HFA) 115-21 MCG/ACT inhaler Inhale 2 puffs into the lungs 2 (two) times daily.    Historical Provider, MD  hydrochlorothiazide (HYDRODIURIL) 25 MG tablet Take 25 mg by mouth daily.    Historical Provider, MD  HYDROcodone-acetaminophen (NORCO/VICODIN) 5-325 MG per tablet Take 1 tablet by mouth every 6 (six) hours as needed for pain.    Historical Provider, MD  methylPREDNISolone acetate (DEPO-MEDROL) 20 MG/ML SUSP injection Inject 10 mg into the muscle every 3 (three) days.    Historical Provider, MD  mometasone (NASONEX) 50 MCG/ACT nasal spray Place 2 sprays into the nose daily.    Historical Provider, MD  montelukast (SINGULAIR) 10 MG tablet Take 10 mg by mouth at bedtime.    Historical Provider, MD  nadolol (CORGARD) 40 MG tablet Take 1 tablet (40 mg total) by mouth daily. 02/05/14   Wardell Honour, MD   BP 117/70 mmHg  Pulse 69  Temp(Src) 97.5 F (36.4 C) (Oral)  Resp 16  Ht 5\' 8"  (1.727 m)  Wt 145 lb (65.772 kg)  BMI 22.05 kg/m2  SpO2 100%  LMP 05/23/2014 Physical Exam  Constitutional: She is oriented to person, place, and time. She appears well-developed and well-nourished. No distress.  HENT:  Head: Normocephalic and atraumatic.  Mouth/Throat: Oropharynx is clear and moist.  Eyes: Conjunctivae and EOM are normal. Pupils are equal, round, and reactive to light.  Neck: Normal range of motion. Neck supple.  Cardiovascular: Normal rate, regular rhythm, normal heart sounds and intact distal pulses.   No murmur heard. Pulmonary/Chest: Effort normal and breath sounds normal. No respiratory distress. She exhibits no tenderness.  Abdominal: Soft. She exhibits no distension. There is no tenderness. There is no rebound and no guarding.  Musculoskeletal: Normal range of motion.  Lymphadenopathy:    She has no cervical adenopathy.  Neurological: She is alert and oriented to person, place, and  time. She has normal strength. No sensory deficit. She exhibits normal muscle tone. Coordination normal. GCS eye subscore is 4. GCS verbal subscore is 5. GCS motor subscore is 6.  Skin: Skin is warm and dry. No rash noted.  Psychiatric: She has a normal mood and affect. Thought content normal.  Nursing note and vitals reviewed.   ED Course  Procedures (including critical care time) Labs Review Labs Reviewed  BASIC METABOLIC PANEL - Abnormal; Notable for the following:    Potassium 3.4 (*)    Glucose, Bld 111 (*)    All other components within normal limits  URINALYSIS, ROUTINE W REFLEX MICROSCOPIC - Abnormal; Notable for the following:    Hgb urine dipstick LARGE (*)    Leukocytes, UA TRACE (*)    All other components within normal limits  URINE MICROSCOPIC-ADD ON - Abnormal; Notable for the following:    Squamous Epithelial / LPF FEW (*)    All other components within normal limits  CBG MONITORING, ED -  Abnormal; Notable for the following:    Glucose-Capillary 101 (*)    All other components within normal limits  CBC WITH DIFFERENTIAL  PREGNANCY, URINE  TROPONIN I    Imaging Review Dg Skull 1-3 Views  05/25/2014   CLINICAL DATA:  History of craniotomy for osteoma.  EXAM: SKULL - 1-3 VIEW  COMPARISON:  None.  FINDINGS: There is no evidence of skull fracture or other focal bone lesions.  Small titanium plate and screw fixation of a RIGHT posterior frontal craniotomy. No worrisome features. No intracranial clips or wires are seen.  Given the history of surgery in 2006, these devices appear compatible with safe performance of MRI.  IMPRESSION: Status post RIGHT posterior frontal craniotomy. Patient appears safe to undergo MRI.   Electronically Signed   By: Rolla Flatten M.D.   On: 05/25/2014 10:07   Mr Brain Wo Contrast  05/25/2014   CLINICAL DATA:  Syncopal episode this morning resulting in a fall from a seated position, striking her head on a concrete floor. Subsequent vomiting. Prior  craniotomy.  EXAM: MRI HEAD WITHOUT CONTRAST  TECHNIQUE: Multiplanar, multiecho pulse sequences of the brain and surrounding structures were obtained without intravenous contrast.  COMPARISON:  None.  FINDINGS: There is no evidence of acute infarct, mass, midline shift, or extra-axial fluid collection. There are 2 punctate foci of susceptibility artifact in the white matter of the right temporal lobe which may reflect remote micro hemorrhages. Ventricles and sulci are within normal limits for age. Patchy T2 hyperintensities are present throughout the subcortical and periventricular white matter bilaterally. No definite infratentorial lesions are identified.  Sequelae of prior right frontoparietal craniotomy are identified. Orbits are unremarkable. Paranasal sinuses and mastoid air cells are clear. Major intracranial vascular flow voids are preserved.  IMPRESSION: 1. No acute intracranial abnormality identified. 2. Extensive foci of cerebral white matter T2 signal abnormality. These are nonspecific but may reflect age advanced chronic small vessel ischemic disease given history of hypertension. Other considerations include demyelinating disease, sequelae of trauma, hypercoagulable state, vasculitis, migraines, and prior infection.   Electronically Signed   By: Logan Bores   On: 05/25/2014 11:12     EKG Interpretation   Date/Time:  Friday May 25 2014 08:06:10 EST Ventricular Rate:  61 PR Interval:  167 QRS Duration: 101 QT Interval:  489 QTC Calculation: 493 R Axis:   -5 Text Interpretation:  Sinus rhythm Borderline prolonged QT interval No old  tracing to compare Confirmed by Winfred Leeds  MD, SAM [phone]) on 05/25/2014  8:45:31 AM      MDM   Final diagnoses:  Syncope  Prolonged Q-T interval on ECG    Patient with syncopal episode this morning with fall from seated position, striking her head.  Multiple episodes of vomiting after the event.  No focal neurological deficits on exam.  Patient  reports feeling back to her neurological baseline other than headache and "tightness" to her right face.  Clinical suspicion for acute intracranial bleed is low.  Plan includes imaging and labs.  CT scanner is currently inoperable, so MRI ordered.   Patient also seen by Dr. Winfred Leeds and care discussed.     Patient is feeling better on recheck, NV intact.   12:13 consulted Dr. Jerilee Hoh, will admit pt to tele bed.      Mylene Bow L. Vanessa [place], PA-C 05/25/14 [place], MD 05/25/14 [id number]

## 2014-05-25 NOTE — H&P (Signed)
[facility] History and Physical  KHALIYAH NORTHROP PIR:[id number] DOB: [birth year] DOA: 05/25/2014  Referring physician:  PCP: Redge Gainer, MD   Chief Complaint: syncope  HPI: Deborah Meyer is a 48 y.o. female with past medical hx of uncontrolled hypertension, asthma, muscular dystrophy presents with the chief complaint of syncope. Initial evaluation reveals hypotension and EKG with a prolonged QT.  Patient reports she was in her usual state of health she was at work this morning at 6 AM she got up out of her chair walked out of her office down the hall and "passed out". She reports feeling lightheaded immediately prior and sat on the ground and then passed out. 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 Patient's blood pressure at that time reportedly was 70/40. She reports a sensation of tightness around the right side of her face and particularly her eye. She reports she has very difficult to control blood pressure and recently her blood pressure medications had been changed but she had not yet instituted the change. She denies chest pain palpitation shortness of breath headache visual disturbances. She does report she awakened and felt nauseated and had a few episodes of vomiting. She denies coffee ground emesis. Workup in the emergency department includes a complete metabolic panel that is unremarkable, complete blood count is unremarkable, urinalysis unremarkable, initial troponin negative, MRI of the brain without acute abnormality, EKG with sinus rhythm and borderline prolonged QT. In the emergency department she is afebrile hemodynamically stable and not hypoxic.all in the emergency department she received 1 L of normal saline and 4 mg of Zofran.    Review of Systems:  And point review of systems completed and all systems are negative except as indicated in history of present illness  Past Medical History  Diagnosis Date  . Hypertension   .  Asthma   . Neuromuscular disorder   . Heart murmur   . Heart attack    Past Surgical History  Procedure Laterality Date  . Tubal ligation    . Osteotomy    . Muscle biopsy    . Craniotomy    . Breast tumors removed     Social History:  reports that she has never smoked. She does not have any smokeless tobacco history on file. She reports that she does not drink alcohol or use illicit drugs. She is married lives with her husband and [place]. 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 Morphine And Related Shortness Of Breath and Other (See Comments)    "Blood pressure bottoms out"   . Mushroom Extract Complex Other (See Comments)    "Stopped breathing"  . Dairy Aid [Lactase] Nausea And Vomiting  . Lyrica [Pregabalin] Nausea And Vomiting    Caused extreme high blood pressure and sycotic episodes.    . Toradol [Ketorolac Tromethamine] Nausea And Vomiting and Other (See Comments)    "passed out"  . Asa [Aspirin] Nausea And Vomiting and Rash  . Penicillins Rash  . Prednisone Rash  . Tramadol Rash    Family History  Problem Relation Age of Onset  . Diabetes Mother   . Hypertension Mother   . Stroke Mother   . Heart disease Mother   . Heart disease Father   . Stroke Father   . Depression Father   . Alzheimer's disease Father   . Diabetes Sister   . Diabetes Brother   . Hyperlipidemia Brother  Prior to Admission medications   Medication Sig Start Date End Date Taking? Authorizing Provider  albuterol (PROVENTIL HFA;VENTOLIN HFA) 108 (90 BASE) MCG/ACT inhaler Inhale 2 puffs into the lungs every 6 (six) hours as needed for wheezing.   Yes Historical Provider, MD  amLODipine (NORVASC) 10 MG tablet Take 10 mg by mouth daily.   Yes Historical Provider, MD  Armodafinil (NUVIGIL) 250 MG tablet Take 250 mg by mouth daily.   Yes Historical Provider, MD  clonazePAM (KLONOPIN) 1 MG tablet Take 1 mg by mouth 2 (two) times  daily as needed for anxiety.   Yes Historical Provider, MD  cyanocobalamin (,VITAMIN B-12,) 1000 MCG/ML injection Inject 1,000 mcg into the muscle once a week. On Sunday.   Yes Historical Provider, MD  fluticasone-salmeterol (ADVAIR HFA) 115-21 MCG/ACT inhaler Inhale 2 puffs into the lungs 2 (two) times daily.   Yes Historical Provider, MD  hydrochlorothiazide (HYDRODIURIL) 25 MG tablet Take 25 mg by mouth daily.   Yes Historical Provider, MD  HYDROcodone-acetaminophen (NORCO/VICODIN) 5-325 MG per tablet Take 1 tablet by mouth every 6 (six) hours as needed for pain.   Yes Historical Provider, MD  labetalol (NORMODYNE) 200 MG tablet Take 200 mg by mouth 3 (three) times daily.   Yes Historical Provider, MD  lisinopril (PRINIVIL,ZESTRIL) 10 MG tablet Take 10 mg by mouth daily.   Yes Historical Provider, MD  methylPREDNISolone acetate (DEPO-MEDROL) 20 MG/ML SUSP injection Inject 10 mg into the muscle every 3 (three) days.   Yes Historical Provider, MD  mometasone (NASONEX) 50 MCG/ACT nasal spray Place 2 sprays into the nose daily.   Yes Historical Provider, MD  montelukast (SINGULAIR) 10 MG tablet Take 10 mg by mouth at bedtime.   Yes Historical Provider, MD  vitamin C (ASCORBIC ACID) 500 MG tablet Take 500 mg by mouth daily.   Yes Historical Provider, MD  vitamin E 400 UNIT capsule Take 400 Units by mouth daily.   Yes Historical Provider, MD  aliskiren (TEKTURNA) 150 MG tablet Take 150 mg by mouth daily.    Historical Provider, MD  nadolol (CORGARD) 40 MG tablet Take 1 tablet (40 mg total) by mouth daily. 02/05/14   Wardell Honour, MD   Physical Exam: Filed Vitals:   05/25/14 0933 05/25/14 1123 05/25/14 1258 05/25/14 1300  BP: 120/66 119/74 120/77   Pulse: 76 66 66 66  Temp:      TempSrc:      Resp: 16 17 13 18   Height:      Weight:      SpO2: 99% 99% 100% 99%    Wt Readings from Last 3 Encounters:  05/25/14 65.772 kg (145 lb)  05/25/14 65.772 kg (145 lb)  02/05/14 69.854 kg (154 lb)     General:  Appears calm and comfortable Eyes: PERRL, normal lids, irises & conjunctiva ENT: grossly normal hearing, because membranes of her mouth are moist and pink Neck: no LAD, masses or thyromegaly Cardiovascular: RRR, no m/r/g. No LE edema.pedal pulses are present and palpable Respiratory: CTA bilaterally, no w/r/r. Normal respiratory effort. Abdomen: soft, ntnd positive bowel sounds throughout no guarding Skin: no rash or induration seen on limited exam Musculoskeletal: grossly normal tone BUE/BLE Psychiatric: grossly normal mood and affect, speech fluent and appropriate Neurologic: grossly non-focal. Speech is clear facial symmetry cranial nerve II through XII grossly intact          Labs on Admission:  Basic Metabolic Panel:  Recent Labs Lab 05/25/14 0809 05/25/14 1211  NA 141  --  K 3.4*  --   CL 105  --   CO2 24  --   GLUCOSE 111*  --   BUN 14  --   CREATININE 0.70  --   CALCIUM 8.8  --   MG  --  2.1   Liver Function Tests: No results for input(s): AST, ALT, ALKPHOS, BILITOT, PROT, ALBUMIN in the last 168 hours. No results for input(s): LIPASE, AMYLASE in the last 168 hours. No results for input(s): AMMONIA in the last 168 hours. CBC:  Recent Labs Lab 05/25/14 0809  WBC 5.4  NEUTROABS 3.7  HGB 12.6  HCT 38.1  MCV 91.4  PLT 184   Cardiac Enzymes:  Recent Labs Lab 05/25/14 0809  TROPONINI <0.30    BNP (last 3 results) No results for input(s): PROBNP in the last 8760 hours. CBG:  Recent Labs Lab 05/25/14 0855  GLUCAP 101*    Radiological Exams on Admission: Dg Skull 1-3 Views  05/25/2014   CLINICAL DATA:  History of craniotomy for osteoma.  EXAM: SKULL - 1-3 VIEW  COMPARISON:  None.  FINDINGS: There is no evidence of skull fracture or other focal bone lesions.  Small titanium plate and screw fixation of a RIGHT posterior frontal craniotomy. No worrisome features. No intracranial clips or wires are seen.  Given the history of surgery in  2006, these devices appear compatible with safe performance of MRI.  IMPRESSION: Status post RIGHT posterior frontal craniotomy. Patient appears safe to undergo MRI.   Electronically Signed   By: Rolla Flatten M.D.   On: 05/25/2014 10:07   Mr Brain Wo Contrast  05/25/2014   CLINICAL DATA:  Syncopal episode this morning resulting in a fall from a seated position, striking her head on a concrete floor. Subsequent vomiting. Prior craniotomy.  EXAM: MRI HEAD WITHOUT CONTRAST  TECHNIQUE: Multiplanar, multiecho pulse sequences of the brain and surrounding structures were obtained without intravenous contrast.  COMPARISON:  None.  FINDINGS: There is no evidence of acute infarct, mass, midline shift, or extra-axial fluid collection. There are 2 punctate foci of susceptibility artifact in the white matter of the right temporal lobe which may reflect remote micro hemorrhages. Ventricles and sulci are within normal limits for age. Patchy T2 hyperintensities are present throughout the subcortical and periventricular white matter bilaterally. No definite infratentorial lesions are identified.  Sequelae of prior right frontoparietal craniotomy are identified. Orbits are unremarkable. Paranasal sinuses and mastoid air cells are clear. Major intracranial vascular flow voids are preserved.  IMPRESSION: 1. No acute intracranial abnormality identified. 2. Extensive foci of cerebral white matter T2 signal abnormality. These are nonspecific but may reflect age advanced chronic small vessel ischemic disease given history of hypertension. Other considerations include demyelinating disease, sequelae of trauma, hypercoagulable state, vasculitis, migraines, and prior infection.   Electronically Signed   By: Logan Bores   On: 05/25/2014 11:12    EKG: Independently reviewed. Eyes rhythm with borderline prolonged QT  Assessment/Plan Principal Problem:   Syncope: etiology uncertain. EKG with borderline prolonged QT so is concern for  arrhythmia. Will admit to telemetry. Will get serial EKGs, cycle troponins and obtain 2-D echo. Patient with history of very difficult to control blood pressure and recently labetalol was increased to 3 times a day versus twice a day but she's not had a chance to institute that change. Will obtain orthostatic vital signs. MRI of her brain without acute abnormalities. I will continue her Norvasc and lisinopril on admission. I will hold her chlorothiazide, labetalol,  and Corgard. Will monitor blood pressure closely and resume medications as indicated. Active Problems:    Prolonged Q-T interval on ECG: unclear etiology. Initial troponin negative. Will monitor on telemetry. Will hold any medications known to affect QT interval. Get serial EKG and echo as indicated above.    Right facial numbness: improved at the time of my exam. She fell on the side of her face from a sitting position during the syncopal event. No swelling sensation intact. MRI negative. Monitor    Hypokalemia: Mild. She's on HCTZ at home probably related to this. Will replete and recheck.  Hypertension. Patient with a history of very difficult to control blood pressure. Home medications include Norvasc, hydrochlorothiazide, labetalol, lisinopril, nadolol. Recently labetalol increased to 3 times a day from twice a day but she's not started that yet.. Reportedly blood pressure 70/40 at seen. Systolic blood pressure range during her time in the emergency department 118/120. I will continue her Norvasc and lisinopril at this time. I will hold her hydrochlorothiazide, labetalol and Corgard. Monitor closely and resume medications as indicated  History of muscular dystrophy. Appears to be stable at baseline. Continue home medications    Code Status: full DVT Prophylaxis: Family Communication: husband daughter at bedside Disposition Plan: home when ready  Time spent: 82 minutes  [facility] Pager [phone]

## 2014-05-25 NOTE — Progress Notes (Signed)
Pt recently seen at [facility] and had an ultrasound of heart performed. I have faxed the request for the release of her medical information to [place], and am awaiting on a response

## 2014-05-25 NOTE — ED Notes (Addendum)
MRI technician to investigate craniotomy surgery to see if it is safe for the patient to have MRI.

## 2014-05-25 NOTE — Progress Notes (Signed)
Patient has arrived to [facility] notified.Will continue to monitor.

## 2014-05-26 DIAGNOSIS — R208 Other disturbances of skin sensation: Secondary | ICD-10-CM

## 2014-05-26 LAB — BASIC METABOLIC PANEL
ANION GAP: 11 (ref 5–15)
BUN: 11 mg/dL (ref 6–23)
CO2: 24 mEq/L (ref 19–32)
Calcium: 8.9 mg/dL (ref 8.4–10.5)
Chloride: 107 mEq/L (ref 96–112)
Creatinine, Ser: 0.71 mg/dL (ref 0.50–1.10)
GLUCOSE: 85 mg/dL (ref 70–99)
POTASSIUM: 3.9 meq/L (ref 3.7–5.3)
Sodium: 142 mEq/L (ref 137–147)

## 2014-05-26 LAB — CBC
HCT: 36.2 % (ref 36.0–46.0)
Hemoglobin: 11.8 g/dL — ABNORMAL LOW (ref 12.0–15.0)
MCH: 30.2 pg (ref 26.0–34.0)
MCHC: 32.6 g/dL (ref 30.0–36.0)
MCV: 92.6 fL (ref 78.0–100.0)
PLATELETS: 198 10*3/uL (ref 150–400)
RBC: 3.91 MIL/uL (ref 3.87–5.11)
RDW: 13.9 % (ref 11.5–15.5)
WBC: 4.4 10*3/uL (ref 4.0–10.5)

## 2014-05-26 LAB — TSH: TSH: 2.19 u[IU]/mL (ref 0.350–4.500)

## 2014-05-26 NOTE — Progress Notes (Signed)
Utilization Review completed.  

## 2014-05-26 NOTE — Discharge Summary (Signed)
Physician Discharge Summary  Deborah Meyer ZJQ:[id number] DOB: [birth year] DOA: 05/25/2014  PCP: Redge Gainer, MD  Admit date: 05/25/2014 Discharge date: 05/26/2014  Time spent: 45 minutes  Recommendations for Outpatient Follow-up:  -will be discharged home today. -Advised to follow-up with her cardiologist in 1 week for placement of a 30 day event monitor and for monitoring of her blood pressure.   Discharge Diagnoses:  Principal Problem:   Syncope Active Problems:   Prolonged Q-T interval on ECG   Right facial numbness   Hypokalemia   Fall   Discharge Condition: Stable and improved  Filed Weights   05/25/14 0802 05/25/14 1333 05/26/14 0533  Weight: 65.772 kg (145 lb) 69.3 kg (152 lb 12.5 oz) 73.891 kg (162 lb 14.4 oz)    History of present illness:  Deborah Meyer is a 48 y.o. female with past medical hx of uncontrolled hypertension, asthma, muscular dystrophy presents with the chief complaint of syncope. Initial evaluation reveals hypotension and EKG with a prolonged QT.  Patient reports she was in her usual state of health she was at work this morning at 6 AM she got up out of her chair walked out of her office down the hall and "passed out". She reports feeling lightheaded immediately prior and sat on the ground and then passed out. 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 Patient's blood pressure at that time reportedly was 70/40. She reports a sensation of tightness around the right side of her face and particularly her eye. She reports she has very difficult to control blood pressure and recently her blood pressure medications had been changed but she had not yet instituted the change. She denies chest pain palpitation shortness of breath headache visual disturbances. She does report she awakened and felt nauseated and had a few episodes of vomiting. She denies coffee ground emesis. Workup in the emergency department includes a  complete metabolic panel that is unremarkable, complete blood count is unremarkable, urinalysis unremarkable, initial troponin negative, MRI of the brain without acute abnormality, EKG with sinus rhythm and borderline prolonged QT. In the emergency department she is afebrile hemodynamically stable and not hypoxic.all in the emergency department she received 1 L of normal saline and 4 mg of Zofran.   Hospital Course:   Syncopal event -suspect related to arrhythmia from prolonged QT versus hypotension. -has not demonstrated any arrhythmia while in the hospital, has ruled out for acute coronary syndrome, MRI negative for CVA. -She has also been noted to be hypotensive with blood pressures in the 90s. She has a history of difficult to control hypertension. I have discontinued her hydrochlorothiazide and labetalol and will discharge her only on Norvasc and lisinopril for now until she is further assessed in the outpatient setting.  Prolonged QT -Electrolytes repleted.  -we have avoided QT prolonging medications. -Have recommended she follow-up with her cardiologist for placement of an event monitor to rule out arrhythmia as a cause of her syncope.  Hypertension -Has been hypotensive in the hospital. -Please see above for details.  Procedures:  None   Consultations:  None  Discharge Instructions  Discharge Instructions    Diet - low sodium heart healthy    Complete by:  As directed      Increase activity slowly    Complete by:  As directed             Medication List    STOP taking these medications  aliskiren 150 MG tablet  Commonly known as:  TEKTURNA     DEPO-MEDROL 20 MG/ML Susp injection  Generic drug:  methylPREDNISolone acetate     hydrochlorothiazide 25 MG tablet  Commonly known as:  HYDRODIURIL     labetalol 200 MG tablet  Commonly known as:  NORMODYNE     mometasone 50 MCG/ACT nasal spray  Commonly known as:  NASONEX     nadolol 40 MG tablet    Commonly known as:  CORGARD      TAKE these medications        albuterol 108 (90 BASE) MCG/ACT inhaler  Commonly known as:  PROVENTIL HFA;VENTOLIN HFA  Inhale 2 puffs into the lungs every 6 (six) hours as needed for wheezing.     amLODipine 10 MG tablet  Commonly known as:  NORVASC  Take 10 mg by mouth daily.     clonazePAM 1 MG tablet  Commonly known as:  KLONOPIN  Take 1 mg by mouth 2 (two) times daily as needed for anxiety.     cyanocobalamin 1000 MCG/ML injection  Commonly known as:  (VITAMIN B-12)  Inject 1,000 mcg into the muscle once a week. On Sunday.     fluticasone-salmeterol 115-21 MCG/ACT inhaler  Commonly known as:  ADVAIR HFA  Inhale 2 puffs into the lungs 2 (two) times daily.     HYDROcodone-acetaminophen 5-325 MG per tablet  Commonly known as:  NORCO/VICODIN  Take 1 tablet by mouth every 6 (six) hours as needed for pain.     lisinopril 10 MG tablet  Commonly known as:  PRINIVIL,ZESTRIL  Take 10 mg by mouth daily.     montelukast 10 MG tablet  Commonly known as:  SINGULAIR  Take 10 mg by mouth at bedtime.     NUVIGIL 250 MG tablet  Generic drug:  Armodafinil  Take 250 mg by mouth daily.     vitamin C 500 MG tablet  Commonly known as:  ASCORBIC ACID  Take 500 mg by mouth daily.     vitamin E 400 UNIT capsule  Take 400 Units by mouth daily.       Allergies  Allergen Reactions  . Morphine And Related Shortness Of Breath and Other (See Comments)    "Blood pressure bottoms out"   . Mushroom Extract Complex Other (See Comments)    "Stopped breathing"  . Dairy Aid [Lactase] Nausea And Vomiting  . Lyrica [Pregabalin] Nausea And Vomiting    Caused extreme high blood pressure and sycotic episodes.    . Toradol [Ketorolac Tromethamine] Nausea And Vomiting and Other (See Comments)    "passed out"  . Asa [Aspirin] Nausea And Vomiting and Rash  . Penicillins Rash  . Prednisone Rash  . Tramadol Rash       Follow-up Information    Follow up with  Redge Gainer, MD. Schedule an appointment as soon as possible for a visit in 2 weeks.   Specialty:  Family Medicine   Contact information:   [place] [address] [phone]       Schedule an appointment as soon as possible for a visit in 2 weeks to follow up.   Why:  With your cardiologist       The results of significant diagnostics from this hospitalization (including imaging, microbiology, ancillary and laboratory) are listed below for reference.    Significant Diagnostic Studies: Dg Skull 1-3 Views  05/25/2014   CLINICAL DATA:  History of craniotomy for osteoma.  EXAM: SKULL -  1-3 VIEW  COMPARISON:  None.  FINDINGS: There is no evidence of skull fracture or other focal bone lesions.  Small titanium plate and screw fixation of a RIGHT posterior frontal craniotomy. No worrisome features. No intracranial clips or wires are seen.  Given the history of surgery in 2006, these devices appear compatible with safe performance of MRI.  IMPRESSION: Status post RIGHT posterior frontal craniotomy. Patient appears safe to undergo MRI.   Electronically Signed   By: Rolla Flatten M.D.   On: 05/25/2014 10:07   Mr Brain Wo Contrast  05/25/2014   CLINICAL DATA:  Syncopal episode this morning resulting in a fall from a seated position, striking her head on a concrete floor. Subsequent vomiting. Prior craniotomy.  EXAM: MRI HEAD WITHOUT CONTRAST  TECHNIQUE: Multiplanar, multiecho pulse sequences of the brain and surrounding structures were obtained without intravenous contrast.  COMPARISON:  None.  FINDINGS: There is no evidence of acute infarct, mass, midline shift, or extra-axial fluid collection. There are 2 punctate foci of susceptibility artifact in the white matter of the right temporal lobe which may reflect remote micro hemorrhages. Ventricles and sulci are within normal limits for age. Patchy T2 hyperintensities are present throughout the subcortical and periventricular white matter  bilaterally. No definite infratentorial lesions are identified.  Sequelae of prior right frontoparietal craniotomy are identified. Orbits are unremarkable. Paranasal sinuses and mastoid air cells are clear. Major intracranial vascular flow voids are preserved.  IMPRESSION: 1. No acute intracranial abnormality identified. 2. Extensive foci of cerebral white matter T2 signal abnormality. These are nonspecific but may reflect age advanced chronic small vessel ischemic disease given history of hypertension. Other considerations include demyelinating disease, sequelae of trauma, hypercoagulable state, vasculitis, migraines, and prior infection.   Electronically Signed   By: Logan Bores   On: 05/25/2014 11:12    Microbiology: No results found for this or any previous visit (from the past 240 hour(s)).   Labs: Basic Metabolic Panel:  Recent Labs Lab 05/25/14 0809 05/25/14 1211 05/26/14 0545  NA 141  --  142  K 3.4*  --  3.9  CL 105  --  107  CO2 24  --  24  GLUCOSE 111*  --  85  BUN 14  --  11  CREATININE 0.70  --  0.71  CALCIUM 8.8  --  8.9  MG  --  2.1  --    Liver Function Tests: No results for input(s): AST, ALT, ALKPHOS, BILITOT, PROT, ALBUMIN in the last 168 hours. No results for input(s): LIPASE, AMYLASE in the last 168 hours. No results for input(s): AMMONIA in the last 168 hours. CBC:  Recent Labs Lab 05/25/14 0809 05/26/14 0545  WBC 5.4 4.4  NEUTROABS 3.7  --   HGB 12.6 11.8*  HCT 38.1 36.2  MCV 91.4 92.6  PLT 184 198   Cardiac Enzymes:  Recent Labs Lab 05/25/14 0809  TROPONINI <0.30   BNP: BNP (last 3 results) No results for input(s): PROBNP in the last 8760 hours. CBG:  Recent Labs Lab 05/25/14 0855  GLUCAP 101*       Signed:  Lelon Frohlich  [facility] Pager: [phone] 05/26/2014, 3:07 PM

## 2014-05-26 NOTE — Progress Notes (Signed)
Discharge instructions reviewed with patient. No distress noted. Patient escorted to lobby via wheelchair to car. IV and Telemetry box removed

## 2014-05-26 NOTE — Progress Notes (Signed)
Informed Dr. Jerilee Hoh po BP medication not given earlier due to bp 99/56. BP now 107/53  PO BP medication given

## 2014-07-05 NOTE — Telephone Encounter (Signed)
Close encounter 

## 2014-09-25 ENCOUNTER — Telehealth: Payer: Self-pay | Admitting: Family Medicine

## 2015-03-06 ENCOUNTER — Ambulatory Visit: Payer: PRIVATE HEALTH INSURANCE | Admitting: Family Medicine

## 2016-02-04 ENCOUNTER — Encounter: Payer: Self-pay | Admitting: Family Medicine

## 2016-08-28 ENCOUNTER — Other Ambulatory Visit: Payer: Self-pay

## 2016-08-28 ENCOUNTER — Emergency Department (HOSPITAL_COMMUNITY): Payer: PRIVATE HEALTH INSURANCE

## 2016-08-28 ENCOUNTER — Encounter (HOSPITAL_COMMUNITY): Payer: Self-pay | Admitting: Emergency Medicine

## 2016-08-28 ENCOUNTER — Observation Stay (HOSPITAL_COMMUNITY): Payer: PRIVATE HEALTH INSURANCE

## 2016-08-28 ENCOUNTER — Observation Stay (HOSPITAL_COMMUNITY)
Admission: EM | Admit: 2016-08-28 | Discharge: 2016-08-28 | Disposition: A | Discharge status: Other Acute Inpatient Hospital | Payer: PRIVATE HEALTH INSURANCE | Attending: Family Medicine | Admitting: Family Medicine

## 2016-08-28 DIAGNOSIS — I4581 Long QT syndrome: Secondary | ICD-10-CM | POA: Insufficient documentation

## 2016-08-28 DIAGNOSIS — Z7951 Long term (current) use of inhaled steroids: Secondary | ICD-10-CM | POA: Insufficient documentation

## 2016-08-28 DIAGNOSIS — G709 Myoneural disorder, unspecified: Secondary | ICD-10-CM | POA: Diagnosis not present

## 2016-08-28 DIAGNOSIS — E876 Hypokalemia: Secondary | ICD-10-CM | POA: Diagnosis not present

## 2016-08-28 DIAGNOSIS — I10 Essential (primary) hypertension: Secondary | ICD-10-CM | POA: Insufficient documentation

## 2016-08-28 DIAGNOSIS — J4530 Mild persistent asthma, uncomplicated: Secondary | ICD-10-CM

## 2016-08-28 DIAGNOSIS — Z7983 Long term (current) use of bisphosphonates: Secondary | ICD-10-CM | POA: Insufficient documentation

## 2016-08-28 DIAGNOSIS — R9431 Abnormal electrocardiogram [ECG] [EKG]: Secondary | ICD-10-CM | POA: Diagnosis not present

## 2016-08-28 DIAGNOSIS — J449 Chronic obstructive pulmonary disease, unspecified: Secondary | ICD-10-CM | POA: Diagnosis present

## 2016-08-28 DIAGNOSIS — I252 Old myocardial infarction: Secondary | ICD-10-CM | POA: Diagnosis not present

## 2016-08-28 DIAGNOSIS — R079 Chest pain, unspecified: Principal | ICD-10-CM | POA: Insufficient documentation

## 2016-08-28 DIAGNOSIS — Z79899 Other long term (current) drug therapy: Secondary | ICD-10-CM | POA: Diagnosis not present

## 2016-08-28 DIAGNOSIS — J45909 Unspecified asthma, uncomplicated: Secondary | ICD-10-CM | POA: Insufficient documentation

## 2016-08-28 HISTORY — DX: Migraine, unspecified, not intractable, without status migrainosus: G43.909

## 2016-08-28 HISTORY — DX: Reserved for inherently not codable concepts without codable children: IMO0001

## 2016-08-28 HISTORY — DX: Iron deficiency: E61.1

## 2016-08-28 LAB — CBC
HEMATOCRIT: 41.4 % (ref 36.0–46.0)
Hemoglobin: 13.6 g/dL (ref 12.0–15.0)
MCH: 28.5 pg (ref 26.0–34.0)
MCHC: 32.9 g/dL (ref 30.0–36.0)
MCV: 86.8 fL (ref 78.0–100.0)
PLATELETS: 271 10*3/uL (ref 150–400)
RBC: 4.77 MIL/uL (ref 3.87–5.11)
RDW: 14.6 % (ref 11.5–15.5)
WBC: 9 10*3/uL (ref 4.0–10.5)

## 2016-08-28 LAB — BASIC METABOLIC PANEL
Anion gap: 9 (ref 5–15)
BUN: 19 mg/dL (ref 6–20)
CALCIUM: 9.1 mg/dL (ref 8.9–10.3)
CO2: 27 mmol/L (ref 22–32)
Chloride: 104 mmol/L (ref 101–111)
Creatinine, Ser: 0.69 mg/dL (ref 0.44–1.00)
GFR calc Af Amer: >60 mL/min (ref >60)
GFR calc non Af Amer: >60 mL/min (ref >60)
Glucose, Bld: 102 mg/dL — ABNORMAL HIGH (ref 65–99)
POTASSIUM: 3 mmol/L — AB (ref 3.5–5.1)
Sodium: 140 mmol/L (ref 135–145)

## 2016-08-28 LAB — POC URINE PREG, ED: Preg Test, Ur: NEGATIVE

## 2016-08-28 LAB — TROPONIN I
TROPONIN I: 0.04 ng/mL — AB (ref <0.03)
Troponin I: 0.04 ng/mL (ref <0.03)

## 2016-08-28 MED ORDER — HYDROCODONE-ACETAMINOPHEN 5-325 MG PO TABS
1.0000 | ORAL_TABLET | Freq: Four times a day (QID) | ORAL | Status: DC | PRN
Start: 2016-08-28 — End: 2016-08-28
  Administered 2016-08-28: 1 via ORAL
  Filled 2016-08-28: qty 1

## 2016-08-28 MED ORDER — NITROGLYCERIN IN D5W 200-5 MCG/ML-% IV SOLN
0.0000 ug/min | INTRAVENOUS | Status: DC
  Administered 2016-08-28: 5 ug/min via INTRAVENOUS
  Filled 2016-08-28: qty 250

## 2016-08-28 MED ORDER — ONDANSETRON HCL 4 MG/2ML IJ SOLN
4.0000 mg | Freq: Once | INTRAMUSCULAR | Status: AC
  Administered 2016-08-28: 4 mg via INTRAVENOUS

## 2016-08-28 MED ORDER — ONDANSETRON HCL 4 MG/2ML IJ SOLN
INTRAMUSCULAR | Status: AC
  Filled 2016-08-28: qty 2

## 2016-08-28 MED ORDER — LABETALOL HCL 5 MG/ML IV SOLN
10.0000 mg | Freq: Once | INTRAVENOUS | Status: AC
  Administered 2016-08-28: 10 mg via INTRAVENOUS
  Filled 2016-08-28: qty 4

## 2016-08-28 MED ORDER — NITROPRUSSIDE SODIUM 25 MG/ML IV SOLN
0.0000 ug/kg/min | INTRAVENOUS | Status: DC
  Filled 2016-08-28: qty 2

## 2016-08-28 MED ORDER — FENTANYL CITRATE (PF) 100 MCG/2ML IJ SOLN
50.0000 ug | INTRAMUSCULAR | Status: DC | PRN
  Administered 2016-08-28: 50 ug via INTRAVENOUS
  Filled 2016-08-28: qty 2

## 2016-08-28 MED ORDER — POTASSIUM CHLORIDE CRYS ER 20 MEQ PO TBCR
40.0000 meq | EXTENDED_RELEASE_TABLET | Freq: Once | ORAL | Status: AC
  Administered 2016-08-28: 40 meq via ORAL
  Filled 2016-08-28: qty 2

## 2016-08-28 MED ORDER — NITROGLYCERIN 0.4 MG SL SUBL
0.4000 mg | SUBLINGUAL_TABLET | SUBLINGUAL | Status: AC | PRN
  Administered 2016-08-28 (×3): 0.4 mg via SUBLINGUAL
  Filled 2016-08-28 (×2): qty 1

## 2016-08-28 MED ORDER — IOPAMIDOL (ISOVUE-370) INJECTION 76%: 100.0000 mL | Freq: Once | INTRAVENOUS | Status: DC | PRN

## 2016-08-28 NOTE — H&P (Addendum)
History and Physical    Deborah Meyer [id number] DOB: 1966/01/03 DOA: 08/28/2016  PCP: Redge Gainer, MD  Patient coming from: Home  Chief Complaint: Chest pain  HPI: Deborah Meyer is a 51 y.o. female with medical history significant of asthma, HTN, MI (3 years ago), neuromuscular disorder who presents from work after developing severe pressure like symptoms in her chest "crushing pain" that began while she was sitting at her desk.  Voices pain then radiated to her left arm and up to her jaw.  Mentions that she felt somewhat nauseous and dizzy as well as lightheaded.  She went to reach for a nitroglycerin but found her bottle empty.  Voices that she took her blood pressure at that time and it was 230/130.  Retook her blood pressure in 10 minutes and says it was barely improved.  Came to the [facility] emergency department as it was the closest ED to her job. Has had similar symptoms previously but nothing to the intensity that this episode was.  Patient reports barely being able to catch her breath.  Denies recent fevers, chills, sick contacts.  Does feels short of breath and that has been sporadic for the past week. Last saw her primary cardiologist in December of 2017.  Has an implantable loop recorder.  ED Course: Seen by EDP.  Blood work showed a slightly elevated troponin of 0.04.  Chest xray showing Lungs are clear. Heart size and pulmonary vascularity are normal. No adenopathy. There is a loop recorder anteriorly on the left. No bone lesions.  WBC WNL, H/H WNL and platelets WNL.  Pregnancy test negative. EKG showing QT prolongation, sinus rhythmn and left axis deviation.  Review of Systems: As per HPI otherwise 10 point review of systems negative.    Past Medical History:  Diagnosis Date  . Asthma   . Heart attack   . Heart murmur   . Hypertension   . Low iron    hx of iron supplementation  . Migraine   . Neuromuscular disorder ([place])    frequent falls  . Normal cardiac stress  test 02/2014   low risk stress echo  . Osteoma    craniotomy 2006    Past Surgical History:  Procedure Laterality Date  . breast tumors removed    . CRANIOTOMY    . LOOP RECORDER IMPLANT  2017, july  . MUSCLE BIOPSY    . OSTEOTOMY    . TUBAL LIGATION       reports that she has never smoked. She has never used smokeless tobacco. She reports that she does not drink alcohol or use drugs.  Allergies  Allergen Reactions  . Contrast Media [Iodinated Diagnostic Agents] Shortness Of Breath    Pt reports nausea, SOB, "passing out" during IV contrast administration in the past.  . Morphine And Related Shortness Of Breath and Other (See Comments)    "Blood pressure bottoms out"   . Mushroom Extract Complex Other (See Comments)    "Stopped breathing"  . Dairy Aid [Lactase] Nausea And Vomiting  . Lyrica [Pregabalin] Nausea And Vomiting    Caused extreme high blood pressure and sycotic episodes.    . Toradol [Ketorolac Tromethamine] Nausea And Vomiting and Other (See Comments)    "passed out"  . Xanax Xr [Alprazolam Er] Hypertension    hallucinations  . Asa [Aspirin] Nausea And Vomiting and Rash  . Penicillins Rash  . Prednisone Rash  . Tramadol Rash    Family History  Problem Relation Age  of Onset  . Diabetes Mother   . Hypertension Mother   . Stroke Mother   . Heart disease Mother   . Heart disease Father   . Stroke Father   . Depression Father   . Alzheimer's disease Father   . Diabetes Sister   . Diabetes Brother   . Hyperlipidemia Brother      Prior to Admission medications   Medication Sig Start Date End Date Taking? Authorizing Provider  albuterol (PROVENTIL HFA;VENTOLIN HFA) 108 (90 BASE) MCG/ACT inhaler Inhale 2 puffs into the lungs every 6 (six) hours as needed for wheezing.   Yes Historical Provider, MD  aliskiren (TEKTURNA) 150 MG tablet Take 1 tablet by mouth daily.   Yes Historical Provider, MD  Armodafinil (NUVIGIL) 250 MG tablet Take 250 mg by mouth daily.    Yes Historical Provider, MD  clonazePAM (KLONOPIN) 1 MG tablet Take 1 mg by mouth 2 (two) times daily as needed for anxiety.   Yes Historical Provider, MD  Coenzyme Q10 50 MG CAPS Take 1 tablet by mouth daily.   Yes Historical Provider, MD  cyanocobalamin (,VITAMIN B-12,) 1000 MCG/ML injection Inject 1,000 mcg into the muscle once a week. On Sunday.   Yes Historical Provider, MD  fluticasone (FLONASE) 50 MCG/ACT nasal spray Place 1 spray into both nostrils daily.   Yes Historical Provider, MD  fluticasone-salmeterol (ADVAIR HFA) 115-21 MCG/ACT inhaler Inhale 2 puffs into the lungs 2 (two) times daily.   Yes Historical Provider, MD  HYDROcodone-acetaminophen (NORCO/VICODIN) 5-325 MG per tablet Take 1 tablet by mouth every 6 (six) hours as needed for pain.   Yes Historical Provider, MD  ibandronate (BONIVA) 150 MG tablet Take 1 tablet by mouth every 30 (thirty) days.   Yes Historical Provider, MD  labetalol (NORMODYNE) 100 MG tablet Take 1 tablet by mouth daily.   Yes Historical Provider, MD  montelukast (SINGULAIR) 10 MG tablet Take 10 mg by mouth at bedtime.   Yes Historical Provider, MD  naproxen (NAPROSYN) 500 MG tablet Take 500 mg by mouth 2 (two) times daily. 08/18/16  Yes Historical Provider, MD  nitroGLYCERIN (NITROSTAT) 0.4 MG SL tablet Place 1 tablet under the tongue every 5 (five) minutes. 08/17/11  Yes Historical Provider, MD  vitamin E 400 UNIT capsule Take 400 Units by mouth daily.   Yes Historical Provider, MD    Physical Exam: Vitals:   08/28/16 1400 08/28/16 1430 08/28/16 1500 08/28/16 1530  BP: (!) 162/102 144/84 144/86 153/90  Pulse: 101 92 85 84  Resp: 19 (!) 27 18 22   Temp:      TempSrc:      SpO2: 100% 99% 98% 99%  Weight:      Height:          Constitutional: NAD, calm, comfortable Vitals:   08/28/16 1400 08/28/16 1430 08/28/16 1500 08/28/16 1530  BP: (!) 162/102 144/84 144/86 153/90  Pulse: 101 92 85 84  Resp: 19 (!) 27 18 22   Temp:      TempSrc:      SpO2:  100% 99% 98% 99%  Weight:      Height:       Eyes: PERRL, lids and conjunctivae normal ENMT: Mucous membranes are moist. Posterior pharynx clear of any exudate or lesions.Normal dentition.  Neck: normal, supple, no masses, no thyromegaly Respiratory: clear to auscultation bilaterally, no wheezing, no crackles. Normal respiratory effort. No accessory muscle use.  Cardiovascular: Regular rate and rhythm, no murmurs / rubs / gallops. No extremity edema.  2+ pedal pulses. No carotid bruits.  Abdomen: no tenderness, no masses palpated. No hepatosplenomegaly. Bowel sounds positive.  Musculoskeletal: no clubbing / cyanosis. No joint deformity upper and lower extremities. Good ROM, no contractures. Normal muscle tone.  Skin: no rashes, lesions, ulcers. No induration Neurologic: CN 2-12 grossly intact. Sensation intact, DTR normal. Strength 5/5 in all 4.  Psychiatric: Normal judgment and insight. Alert and oriented x 3. Normal mood.     Labs on Admission: I have personally reviewed following labs and imaging studies  CBC:  Recent Labs Lab 08/28/16 0953  WBC 9.0  HGB 13.6  HCT 41.4  MCV 86.8  PLT 99991111   Basic Metabolic Panel:  Recent Labs Lab 08/28/16 0953  NA 140  K 3.0*  CL 104  CO2 27  GLUCOSE 102*  BUN 19  CREATININE 0.69  CALCIUM 9.1   GFR: Estimated Creatinine Clearance: 84.9 mL/min (by C-G formula based on SCr of 0.69 mg/dL). Liver Function Tests: No results for input(s): AST, ALT, ALKPHOS, BILITOT, PROT, ALBUMIN in the last 168 hours. No results for input(s): LIPASE, AMYLASE in the last 168 hours. No results for input(s): AMMONIA in the last 168 hours. Coagulation Profile: No results for input(s): INR, PROTIME in the last 168 hours. Cardiac Enzymes:  Recent Labs Lab 08/28/16 0953 08/28/16 1319  TROPONINI 0.04* 0.04*   BNP (last 3 results) No results for input(s): PROBNP in the last 8760 hours. HbA1C: No results for input(s): HGBA1C in the last 72  hours. CBG: No results for input(s): GLUCAP in the last 168 hours. Lipid Profile: No results for input(s): CHOL, HDL, LDLCALC, TRIG, CHOLHDL, LDLDIRECT in the last 72 hours. Thyroid Function Tests: No results for input(s): TSH, T4TOTAL, FREET4, T3FREE, THYROIDAB in the last 72 hours. Anemia Panel: No results for input(s): VITAMINB12, FOLATE, FERRITIN, TIBC, IRON, RETICCTPCT in the last 72 hours. Urine analysis:    Component Value Date/Time   COLORURINE YELLOW 05/25/2014 0930   APPEARANCEUR CLEAR 05/25/2014 0930   LABSPEC 1.020 05/25/2014 0930   PHURINE 6.5 05/25/2014 0930   GLUCOSEU NEGATIVE 05/25/2014 0930   HGBUR LARGE (A) 05/25/2014 0930   BILIRUBINUR NEGATIVE 05/25/2014 0930   BILIRUBINUR neg 02/05/2014 1150   KETONESUR NEGATIVE 05/25/2014 0930   PROTEINUR NEGATIVE 05/25/2014 0930   UROBILINOGEN 0.2 05/25/2014 0930   NITRITE NEGATIVE 05/25/2014 0930   LEUKOCYTESUR TRACE (A) 05/25/2014 0930   Sepsis Labs: !!!!!!!!!!!!!!!!!!!!!!!!!!!!!!!!!!!!!!!!!!!! @LABRCNTIP (procalcitonin:4,lacticidven:4) )No results found for this or any previous visit (from the past 240 hour(s)).   Radiological Exams on Admission: Dg Chest 2 View  Result Date: 08/28/2016 CLINICAL DATA:  Chest pain radiating to left arm and left mandible EXAM: CHEST  2 VIEW COMPARISON:  None. FINDINGS: Lungs are clear. Heart size and pulmonary vascularity are normal. No adenopathy. There is a loop recorder anteriorly on the left. No bone lesions. IMPRESSION: No edema or consolidation. Electronically Signed   By: Lowella Grip III M.D.   On: 08/28/2016 10:53    EKG: Independently reviewed. Sinus rhythm, left axis deviation, QT prolongation  Assessment/Plan Principal Problem:   Chest pain Active Problems:   Hypertension   Asthma   Neuromuscular disorder (HCC)   Prolonged Q-T interval on ECG     Chest pain - symptoms concerning for unstable angina - discussed with patients cardiologist Dr. Alroy Dust.  He agreed  patient will likely need ischemic workup.  Stated that if patient urgently needed that she should be sent to nearest tertiary care center.  Later called Dr. Alroy Dust back  to further discuss plan- if cardiac enzymes increase or pain returns she would need transfer to [facility] since she is a patient in that system.  After H&P done patient voiced return of chest pain- with return of chest pain and still elevated BP will initiate transport.  Will continue current care until transfer can be arranged.  Discussed with [facility] that voiced concern about patient getting transported due to delay in vehicles to be arranged.  [facility] to assist in transporting patient. - serial troponins - telemetry - concern for possible dissection but patient has significant allergy to contrast dye: ED nurse Megan discussed with CT personnel who state no pre-emptive medication to be given for patients symptoms - nitro drip to keep systolic BP 0000000 - discussed with [facility] hospitalist Dr. [place] Scott who agreed to take patient in transfer and encouraged nitro drip for further management of chest pain and HTN  HTN - patient states her blood pressure has not been that high previously - received nitroglycerin with improvement in BP - will start nitro drip to control BP  Asthma - under control  Prolonged Q-T interval on EKG - avoid QT prolongation - patient given IV potassium replacement for hypokalema  Hypokalemia - 29mEq given in ED   DVT prophylaxis: SCDs Code Status: Full code Family Communication: Discussed with patient's husband who is bedside  Disposition Plan: [facility] ED Consults called: Hospitalist at [facility] hospital Admission status:  Transferring to ED at [place] MD [facility] Pager [phone]  If 7PM-7AM, please contact night-coverage [email or web address] Password [facility]  08/28/2016, 5:23 PM

## 2016-08-28 NOTE — ED Provider Notes (Signed)
Deborah Meyer DEPT Provider Note   CSN: [id number] Arrival date & time: 08/28/16  0929     History   Chief Complaint Chief Complaint  Patient presents with  . Chest Pain    HPI Deborah Meyer is a 51 y.o. female.  HPI  Pt was seen at 0955. Per pt, c/o gradual onset and persistence of constant left sided chest "pain" that began approximately 30 minutes PTA. Has been associated with nausea and lightheadedness. Pt describes the CP as "pressure," with radiation into her left arm and jaw. Endorses her Cardiologist told her she "had a heart attack" 3 years ago.  Pt states she ran out of her own SL ntg. Denies palpitations, no cough, no abd pain, no vomiting/diarrhea, no back pain.   Past Medical History:  Diagnosis Date  . Asthma   . Heart attack   . Heart murmur   . Hypertension   . Low iron    hx of iron supplementation  . Migraine   . Neuromuscular disorder ([place])    frequent falls  . Normal cardiac stress test 02/2014   low risk stress echo  . Osteoma    craniotomy 2006    Patient Active Problem List   Diagnosis Date Noted  . Prolonged Q-T interval on ECG 05/25/2014  . Syncope 05/25/2014  . Right facial numbness 05/25/2014  . Hypokalemia 05/25/2014  . Fall   . Hypertension   . Asthma   . Neuromuscular disorder ([place])   . Heart murmur     Past Surgical History:  Procedure Laterality Date  . breast tumors removed    . CRANIOTOMY    . LOOP RECORDER IMPLANT  2017, july  . MUSCLE BIOPSY    . OSTEOTOMY    . TUBAL LIGATION      OB History    No data available       Home Medications    Prior to Admission medications   Medication Sig Start Date End Date Taking? Authorizing Provider  albuterol (PROVENTIL HFA;VENTOLIN HFA) 108 (90 BASE) MCG/ACT inhaler Inhale 2 puffs into the lungs every 6 (six) hours as needed for wheezing.   Yes Historical Provider, MD  aliskiren (TEKTURNA) 150 MG tablet Take 1 tablet by mouth daily.   Yes Historical Provider, MD    Armodafinil (NUVIGIL) 250 MG tablet Take 250 mg by mouth daily.   Yes Historical Provider, MD  clonazePAM (KLONOPIN) 1 MG tablet Take 1 mg by mouth 2 (two) times daily as needed for anxiety.   Yes Historical Provider, MD  Coenzyme Q10 50 MG CAPS Take 1 tablet by mouth daily.   Yes Historical Provider, MD  cyanocobalamin (,VITAMIN B-12,) 1000 MCG/ML injection Inject 1,000 mcg into the muscle once a week. On Sunday.   Yes Historical Provider, MD  fluticasone (FLONASE) 50 MCG/ACT nasal spray Place 1 spray into both nostrils daily.   Yes Historical Provider, MD  fluticasone-salmeterol (ADVAIR HFA) 115-21 MCG/ACT inhaler Inhale 2 puffs into the lungs 2 (two) times daily.   Yes Historical Provider, MD  HYDROcodone-acetaminophen (NORCO/VICODIN) 5-325 MG per tablet Take 1 tablet by mouth every 6 (six) hours as needed for pain.   Yes Historical Provider, MD  ibandronate (BONIVA) 150 MG tablet Take 1 tablet by mouth every 30 (thirty) days.   Yes Historical Provider, MD  labetalol (NORMODYNE) 100 MG tablet Take 1 tablet by mouth daily.   Yes Historical Provider, MD  montelukast (SINGULAIR) 10 MG tablet Take 10 mg by mouth at bedtime.  Yes Historical Provider, MD  naproxen (NAPROSYN) 500 MG tablet Take 500 mg by mouth 2 (two) times daily. 08/18/16  Yes Historical Provider, MD  nitroGLYCERIN (NITROSTAT) 0.4 MG SL tablet Place 1 tablet under the tongue every 5 (five) minutes. 08/17/11  Yes Historical Provider, MD  vitamin E 400 UNIT capsule Take 400 Units by mouth daily.   Yes Historical Provider, MD    Family History Family History  Problem Relation Age of Onset  . Diabetes Mother   . Hypertension Mother   . Stroke Mother   . Heart disease Mother   . Heart disease Father   . Stroke Father   . Depression Father   . Alzheimer's disease Father   . Diabetes Sister   . Diabetes Brother   . Hyperlipidemia Brother     Social History Social History  Substance Use Topics  . Smoking status: Never Smoker   . Smokeless tobacco: Never Used  . Alcohol use No     Allergies   Morphine and related; Mushroom extract complex; Dairy aid [lactase]; Lyrica [pregabalin]; Toradol [ketorolac tromethamine]; Xanax xr [alprazolam er]; Asa [aspirin]; Penicillins; Prednisone; and Tramadol   Review of Systems Review of Systems ROS: Statement: All systems negative except as marked or noted in the HPI; Constitutional: Negative for fever and chills. ; ; Eyes: Negative for eye pain, redness and discharge. ; ; ENMT: Negative for ear pain, hoarseness, nasal congestion, sinus pressure and sore throat. ; ; Cardiovascular: +CP. Negative for palpitations, diaphoresis, dyspnea and peripheral edema. ; ; Respiratory: Negative for cough, wheezing and stridor. ; ; Gastrointestinal: +nausea. Negative for vomiting, diarrhea, abdominal pain, blood in stool, hematemesis, jaundice and rectal bleeding. . ; ; Genitourinary: Negative for dysuria, flank pain and hematuria. ; ; Musculoskeletal: Negative for back pain and neck pain. Negative for swelling and trauma.; ; Skin: Negative for pruritus, rash, abrasions, blisters, bruising and skin lesion.; ; Neuro: Negative for headache, lightheadedness and neck stiffness. Negative for weakness, altered level of consciousness, altered mental status, extremity weakness, paresthesias, involuntary movement, seizure and syncope.       Physical Exam Updated Vital Signs BP 153/99   Pulse 77   Temp 98.2 F (36.8 C) (Oral)   Resp 13   Ht 5\' 8"  (1.727 m)   Wt 155 lb (70.3 kg)   LMP 08/24/2016   SpO2 98%   BMI 23.57 kg/m   Physical Exam 1000: Physical examination:  Nursing notes reviewed; Vital signs and O2 SAT reviewed;  Constitutional: Well developed, Well nourished, Well hydrated, In no acute distress; Head:  Normocephalic, atraumatic; Eyes: EOMI, PERRL, No scleral icterus; ENMT: Mouth and pharynx normal, Mucous membranes moist; Neck: Supple, Full range of motion, No lymphadenopathy;  Cardiovascular: Regular rate and rhythm, No gallop; Respiratory: Breath sounds clear & equal bilaterally, No wheezes.  Speaking full sentences with ease, Normal respiratory effort/excursion; Chest: Nontender, Movement normal; Abdomen: Soft, Nontender, Nondistended, Normal bowel sounds; Genitourinary: No CVA tenderness; Extremities: Pulses normal, No tenderness, No edema, No calf edema or asymmetry.; Neuro: AA&Ox3, Major CN grossly intact.  Speech clear. No gross focal motor or sensory deficits in extremities.; Skin: Color normal, Warm, Dry.   ED Treatments / Results  Labs (all labs ordered are listed, but only abnormal results are displayed)   EKG  EKG Interpretation  Date/Time:  Friday August 28 2016 09:46:13 EST Ventricular Rate:  79 PR Interval:    QRS Duration: 105 QT Interval:  434 QTC Calculation: 498 R Axis:   -30 Text Interpretation:  Sinus rhythm Left axis deviation Borderline prolonged QT interval Artifact When compared with ECG of 05/26/2014 QT has lengthened Confirmed by [facility]  MD, Nunzio Cory [phone]) on 08/28/2016 10:51:35 AM       Radiology   Procedures Procedures (including critical care time)  Medications Ordered in ED Medications  nitroGLYCERIN (NITROSTAT) SL tablet 0.4 mg (0.4 mg Sublingual Given 08/28/16 1013)  fentaNYL (SUBLIMAZE) injection 50 mcg (50 mcg Intravenous Given 08/28/16 1013)  potassium chloride SA (K-DUR,KLOR-CON) CR tablet 40 mEq (not administered)  ondansetron (ZOFRAN) injection 4 mg (4 mg Intravenous Given 08/28/16 1021)     Initial Impression / Assessment and Plan / ED Course  I have reviewed the triage vital signs and the nursing notes.  Pertinent labs & imaging results that were available during my care of the patient were reviewed by me and considered in my medical decision making (see chart for details).  MDM Reviewed: nursing note, previous chart and vitals Reviewed previous: labs and ECG Interpretation: labs, ECG and x-ray   Results  for orders placed or performed during the hospital encounter of [id number]  Basic metabolic panel  Result Value Ref Range   Sodium 140 135 - 145 mmol/L   Potassium 3.0 (L) 3.5 - 5.1 mmol/L   Chloride 104 101 - 111 mmol/L   CO2 27 22 - 32 mmol/L   Glucose, Bld 102 (H) 65 - 99 mg/dL   BUN 19 6 - 20 mg/dL   Creatinine, Ser 0.69 0.44 - 1.00 mg/dL   Calcium 9.1 8.9 - 10.3 mg/dL   GFR calc non Af Amer >60 >60 mL/min   GFR calc Af Amer >60 >60 mL/min   Anion gap 9 5 - 15  CBC  Result Value Ref Range   WBC 9.0 4.0 - 10.5 K/uL   RBC 4.77 3.87 - 5.11 MIL/uL   Hemoglobin 13.6 12.0 - 15.0 g/dL   HCT 41.4 36.0 - 46.0 %   MCV 86.8 78.0 - 100.0 fL   MCH 28.5 26.0 - 34.0 pg   MCHC 32.9 30.0 - 36.0 g/dL   RDW 14.6 11.5 - 15.5 %   Platelets 271 150 - 400 K/uL  Troponin I  Result Value Ref Range   Troponin I 0.04 (HH) <0.03 ng/mL   Dg Chest 2 View Result Date: 08/28/2016 CLINICAL DATA:  Chest pain radiating to left arm and left mandible EXAM: CHEST  2 VIEW COMPARISON:  None. FINDINGS: Lungs are clear. Heart size and pulmonary vascularity are normal. No adenopathy. There is a loop recorder anteriorly on the left. No bone lesions. IMPRESSION: No edema or consolidation. Electronically Signed   By: Lowella Grip III M.D.   On: 08/28/2016 10:53    1230:  Pt with multiple drug allergies. Pt given IV fentanyl, SL ntg with improvement of symptoms. Dx and testing d/w pt and family.  Questions answered.  Verb understanding, agreeable to admit. T/C to [facility] Dr. Adair Patter, case discussed, including:  HPI, pertinent PM/SHx, VS/PE, dx testing, ED course and treatment:  Agreeable to come to ED for evaluation.  Final Clinical Impressions(s) / ED Diagnoses   Final diagnoses:  None    New Prescriptions New Prescriptions   No medications on file     Francine Graven, DO 09/01/16 [id number]

## 2016-08-28 NOTE — ED Notes (Signed)
CRITICAL VALUE ALERT  Critical value received:  Troponin 0.04  Date of notification: 02/092018  Time of notification: 1105   Nurse who received alert:  Susa Day  MD notified (1st page): Dr. Thurnell Garbe

## 2016-08-28 NOTE — ED Triage Notes (Addendum)
Pt reports that she was sitting at her desk when left sided chest pain started . Pain in left chest with radiation to left arm and jaw. Reports MI 3 years ago. Complaining of nausea and lightheaded. Reports that she is out of nitro. Pain has subsided 6/10 complains of nausea. BP 216/158 at time

## 2016-08-28 NOTE — ED Notes (Addendum)
Hospitalist returned page. No order for nausea medication at this time. Nitro to be given for chest pain.  MD reported pt will be transferred to [facility] due to increasing chest pain. Pt notified.

## 2016-08-28 NOTE — ED Notes (Signed)
Continues to c/o nausea.  Chest pain is better.  Rates a 4/10 .

## 2016-08-28 NOTE — ED Notes (Signed)
Hospitalist notified of pt's allergy to IV dye with CT Angio Chest order. CT called to make them aware.

## 2016-08-28 NOTE — ED Notes (Signed)
Pt c/o increased nausea and chest pain. Pt requesting nausea medication. Nitro to be given. MD paged for nausea medication per pt request.

## 2016-08-28 NOTE — ED Notes (Signed)
Pt c/o palpitations, another RN called to room. RN reported pulse was in the 70's she heard pauses when assessing heart sounds. Pt then said the palpitations went away very quickly. EKG was done showing NSR. MD notified. No further orders given at this time.

## 2016-09-03 ENCOUNTER — Ambulatory Visit: Payer: PRIVATE HEALTH INSURANCE | Admitting: Family Medicine

## 2016-10-14 ENCOUNTER — Ambulatory Visit (INDEPENDENT_AMBULATORY_CARE_PROVIDER_SITE_OTHER): Payer: PRIVATE HEALTH INSURANCE | Admitting: Family Medicine

## 2016-10-14 ENCOUNTER — Encounter: Payer: Self-pay | Admitting: Family Medicine

## 2016-10-14 VITALS — BP 126/78 | HR 83 | Temp 97.0°F

## 2016-10-14 DIAGNOSIS — I6990 Unspecified sequelae of unspecified cerebrovascular disease: Secondary | ICD-10-CM | POA: Diagnosis not present

## 2016-10-14 DIAGNOSIS — I10 Essential (primary) hypertension: Secondary | ICD-10-CM

## 2016-10-14 DIAGNOSIS — E785 Hyperlipidemia, unspecified: Secondary | ICD-10-CM | POA: Diagnosis not present

## 2016-10-14 MED ORDER — ONDANSETRON HCL 4 MG PO TABS: 4.0000 mg | ORAL_TABLET | Freq: Three times a day (TID) | ORAL | 0 refills | Status: DC | PRN

## 2016-10-14 MED ORDER — ALISKIREN FUMARATE 150 MG PO TABS: 150.0000 mg | ORAL_TABLET | Freq: Every day | ORAL | 1 refills | Status: DC

## 2016-10-14 NOTE — Progress Notes (Signed)
HPI  Patient presents today for hospital follow-up after CVA.  Patient had CVA on 09/02/2016, she has just been released from the hospital 2 weeks ago. Patient states that since being at home she has had some intermittent nausea. This seems to be improved since leaving the hospital, however she does not have a good explanation for it.  She also has an unusual neuromuscular disorder, she's been well established with an urologist at [facility] neurology for this. She states that they initially placed her on permanent disability, she needs PEG were filled out for disability again today.  She states that overall she feels much better and has been happy with some of the function that she has recovered after her stroke. He still has some residual symptoms including tiredness, intermittent seizures, confusion, short-term memory loss, right hand tremor and weakness in right lower extremity weakness. She's been fitted with a right knee brace and AFO, she should be receiving this in the next week or so.  Asthma and COPD are stable, she sees [facility] for this.  Resting some Zofran and refill on Aliskiren  PMH: Smoking status noted ROS: Per HPI  Objective: BP 126/78   Pulse 83   Temp 97 F (36.1 C) (Oral)  Gen: NAD, alert, cooperative with exam HEENT: NCAT CV: RRR, good S1/S2, no murmur Resp: CTABL, no wheezes, non-labored Abd: SNTND, BS present, no guarding or organomegaly Ext: No edema, warm Neuro: Alert and oriented, No gross deficits  Lab review from care everywhere, 3/14 Hemoglobin 12.2, WBC 7.3.  09/24/2016 Sodium 140, chloride 102, potassium 4.2, CO2 25, creatinine 0.6, glucose 89  On 09/06/2016 Total cholesterol 238, LDL 118, HDL 79  Assessment and plan:  # Late effects of stroke Patient on statin, Plavix Symptoms are stable. Patient has new seizure activity after the stroke, she is following up with neurology tomorrow for this. She is on Lamictal as well.  #  Hypertension Well-controlled on current medications including Aliskiren, labetalol, iso-drill  No changes Labs  # HLD  [place] checked on 2/18 with total cholesterol 238, LDL 118, new LDL goal less than 70 given stroke. Repeat labs     Meds ordered this encounter  Medications  . diazepam (VALIUM) 5 MG tablet    Sig: Take 1 tablet by mouth every 6 (six) hours.    Refill:  0  . EPINEPHrine 0.3 mg/0.3 mL IJ SOAJ injection    Sig: inject 0.3 ML into THE muscle ONCE as needed FOR anaphylaxis    Refill:  0  . isosorbide dinitrate (ISORDIL) 10 MG tablet    Sig: Take 10 mg by mouth 3 (three) times daily.    Refill:  0  . lamoTRIgine (LAMICTAL) 25 MG tablet    Sig: Take 2 tablets by mouth 2 (two) times daily.    Refill:  0  . labetalol (NORMODYNE) 100 MG tablet    Sig: Take 1 tablet by mouth every 12 (twelve) hours.  . Omega-3 Fatty Acids (FISH OIL) 1000 MG CAPS    Sig: Take 1,000 mg by mouth 2 (two) times daily.  . baclofen (LIORESAL) 20 MG tablet    Sig: Take 1 tablet by mouth 2 (two) times daily.  Marland Kitchen atorvastatin (LIPITOR) 20 MG tablet    Sig: Take 1 tablet by mouth daily.  . clopidogrel (PLAVIX) 75 MG tablet    Sig: Take 1 tablet by mouth daily.  . MethylPREDNISolone Acetate (DEPO-MEDROL IJ)    Sig: Inject 10 mg as directed every  3 (three) days.  Marland Kitchen aliskiren (TEKTURNA) 150 MG tablet    Sig: Take 1 tablet (150 mg total) by mouth daily.    Dispense:  90 tablet    Refill:  [place], MD [facility] 10/14/2016, 9:58 AM

## 2016-10-14 NOTE — Patient Instructions (Signed)
Great to meet you!  Come back in 4 months unless you need [place] sooner.   We will call with lab results within 1 week

## 2016-10-15 ENCOUNTER — Other Ambulatory Visit: Payer: Self-pay | Admitting: *Deleted

## 2016-10-15 LAB — CMP14+EGFR
ALK PHOS: 91 IU/L (ref 39–117)
ALT: 20 IU/L (ref 0–32)
AST: 21 IU/L (ref 0–40)
Albumin/Globulin Ratio: 2 (ref 1.2–2.2)
Albumin: 4.3 g/dL (ref 3.5–5.5)
BUN/Creatinine Ratio: 29 — ABNORMAL HIGH (ref 9–23)
BUN: 20 mg/dL (ref 6–24)
CHLORIDE: 106 mmol/L (ref 96–106)
CO2: 20 mmol/L (ref 18–29)
Calcium: 9.5 mg/dL (ref 8.7–10.2)
Creatinine, Ser: 0.69 mg/dL (ref 0.57–1.00)
GFR calc non Af Amer: 102 mL/min/{1.73_m2} (ref >59)
GFR, EST AFRICAN AMERICAN: 117 mL/min/{1.73_m2} (ref >59)
GLUCOSE: 100 mg/dL — AB (ref 65–99)
Globulin, Total: 2.2 g/dL (ref 1.5–4.5)
Potassium: 4.2 mmol/L (ref 3.5–5.2)
Sodium: 143 mmol/L (ref 134–144)
TOTAL PROTEIN: 6.5 g/dL (ref 6.0–8.5)

## 2016-10-15 LAB — LIPID PANEL
CHOLESTEROL TOTAL: 325 mg/dL — AB (ref 100–199)
Chol/HDL Ratio: 3.3 ratio units (ref 0.0–4.4)
HDL: 100 mg/dL (ref >39)
LDL Calculated: 195 mg/dL — ABNORMAL HIGH (ref 0–99)
Triglycerides: 151 mg/dL — ABNORMAL HIGH (ref 0–149)
VLDL CHOLESTEROL CAL: 30 mg/dL (ref 5–40)

## 2016-10-15 MED ORDER — ATORVASTATIN CALCIUM 40 MG PO TABS: 40.0000 mg | ORAL_TABLET | Freq: Every day | ORAL | 0 refills | Status: DC

## 2016-10-27 ENCOUNTER — Encounter: Payer: Self-pay | Admitting: Family

## 2016-10-27 ENCOUNTER — Ambulatory Visit (INDEPENDENT_AMBULATORY_CARE_PROVIDER_SITE_OTHER): Payer: PRIVATE HEALTH INSURANCE | Admitting: Family

## 2016-10-27 VITALS — BP 88/50 | HR 77 | Temp 96.6°F

## 2016-10-27 DIAGNOSIS — R197 Diarrhea, unspecified: Secondary | ICD-10-CM

## 2016-10-27 DIAGNOSIS — I959 Hypotension, unspecified: Secondary | ICD-10-CM | POA: Diagnosis not present

## 2016-10-27 DIAGNOSIS — A084 Viral intestinal infection, unspecified: Secondary | ICD-10-CM | POA: Diagnosis not present

## 2016-10-27 DIAGNOSIS — E860 Dehydration: Secondary | ICD-10-CM

## 2016-10-27 MED ORDER — ONDANSETRON HCL 4 MG PO TABS: 4.0000 mg | ORAL_TABLET | Freq: Three times a day (TID) | ORAL | 0 refills | Status: DC | PRN

## 2016-10-27 NOTE — Progress Notes (Signed)
   Subjective:    Patient ID: Deborah Meyer, female    DOB: 12/02/1965, 51 y.o.   MRN: [id number]  Diarrhea   The current episode started today. The problem occurs 5 to 10 times per day. Associated symptoms include abdominal pain, chills, a fever and vomiting.  Emesis   This is a new problem. The current episode started in the past 7 days. The problem occurs 2 to 4 times per day. The maximum temperature recorded prior to her arrival was 101 - 101.9 F. The fever has been present for less than 1 day. Associated symptoms include abdominal pain, chills, diarrhea and a fever. Pertinent negatives include no dizziness. She has tried bed rest and acetaminophen for the symptoms. The treatment provided mild relief.      Review of Systems  Constitutional: Positive for chills and fever.  Gastrointestinal: Positive for abdominal pain, diarrhea and vomiting.  Neurological: Positive for weakness. Negative for dizziness.  All other systems reviewed and are negative.      Objective:   Physical Exam  Constitutional: She is oriented to person, place, and time. She appears well-developed and well-nourished. No distress.  HENT:  Head: Normocephalic.  Eyes: Pupils are equal, round, and reactive to light.  Neck: Normal range of motion. Neck supple. No thyromegaly present.  Cardiovascular: Normal rate, regular rhythm, normal heart sounds and intact distal pulses.   No murmur heard. Pulmonary/Chest: Effort normal and breath sounds normal. No respiratory distress. She has no wheezes.  Abdominal: Soft. Bowel sounds are normal. She exhibits no distension. There is no tenderness.  Musculoskeletal: Normal range of motion. She exhibits no edema or tenderness.  Neurological: She is alert and oriented to person, place, and time. She has normal reflexes. No cranial nerve deficit.  Skin: Skin is warm and dry.  Psychiatric: She has a normal mood and affect. Her behavior is normal. Judgment and thought content  normal.  Vitals reviewed.     BP (!) 77/50   Pulse 79   Temp (!) 96.6 F (35.9 C) (Oral)      Assessment & Plan:  1. Viral gastroenteritis -Force fluids Rest - ondansetron (ZOFRAN) 4 MG tablet; Take 1 tablet (4 mg total) by mouth every 8 (eight) hours as needed for nausea or vomiting.  Dispense: 20 tablet; Refill: 0  2. Hypotension, unspecified hypotension type  3. Diarrhea, unspecified type Imodium   4. Dehydration   PT told to go to ED because of hypotension and dehydration [place] precaution discussed  RTO prn    Evelina Dun, FNP

## 2016-10-27 NOTE — Patient Instructions (Signed)

## 2016-11-13 ENCOUNTER — Ambulatory Visit (INDEPENDENT_AMBULATORY_CARE_PROVIDER_SITE_OTHER): Payer: PRIVATE HEALTH INSURANCE

## 2016-11-13 ENCOUNTER — Ambulatory Visit (INDEPENDENT_AMBULATORY_CARE_PROVIDER_SITE_OTHER): Payer: PRIVATE HEALTH INSURANCE | Admitting: Family

## 2016-11-13 ENCOUNTER — Encounter: Payer: Self-pay | Admitting: Family

## 2016-11-13 VITALS — BP 97/66 | HR 70 | Temp 97.5°F | Ht 68.0 in | Wt 142.0 lb

## 2016-11-13 DIAGNOSIS — R1310 Dysphagia, unspecified: Secondary | ICD-10-CM

## 2016-11-13 DIAGNOSIS — R630 Anorexia: Secondary | ICD-10-CM

## 2016-11-13 DIAGNOSIS — R110 Nausea: Secondary | ICD-10-CM | POA: Diagnosis not present

## 2016-11-13 DIAGNOSIS — J690 Pneumonitis due to inhalation of food and vomit: Secondary | ICD-10-CM

## 2016-11-13 DIAGNOSIS — Z09 Encounter for follow-up examination after completed treatment for conditions other than malignant neoplasm: Secondary | ICD-10-CM

## 2016-11-13 NOTE — Progress Notes (Signed)
   Subjective:    Patient ID: Deborah Meyer, female    DOB: October 01, 1965, 51 y.o.   MRN: [id number]  HPI Pt presents to the office today for hospital follow up. Pt was seen in the office on 10/27/16 with hypotension, diarrhea, and nausea. Pt was told to go the ED. PT was admitted and discharged on 11/06/16. PT was diagnosed with C Diff, gastroenteritis, dehydration, and hypotension. Pt reports feeling better, continues nausea and decreased appetite.  Pt has h/o Muscular dystrophy and has dysphagia.     Review of Systems  Constitutional: Positive for appetite change and fatigue. Negative for chills and fever.  Gastrointestinal: Positive for nausea. Negative for constipation and vomiting.  Genitourinary: Negative.   All other systems reviewed and are negative.      Objective:   Physical Exam  Constitutional: She is oriented to person, place, and time. She appears well-developed and well-nourished. No distress.  HENT:  Head: Normocephalic and atraumatic.  Eyes: Pupils are equal, round, and reactive to light.  Neck: Normal range of motion. Neck supple. No thyromegaly present.  Cardiovascular: Normal rate, regular rhythm, normal heart sounds and intact distal pulses.   No murmur heard. Pulmonary/Chest: Effort normal. No respiratory distress.  Abdominal: Soft. Bowel sounds are normal. She exhibits no distension. There is no tenderness.  Musculoskeletal: Normal range of motion. She exhibits no edema or tenderness.  Neurological: She is alert and oriented to person, place, and time.  Skin: Skin is warm and dry.  Psychiatric: She has a normal mood and affect. Her behavior is normal. Judgment and thought content normal.  Vitals reviewed.     BP 97/66   Pulse 70   Temp 97.5 F (36.4 C) (Oral)   Ht '5\' 8"'$  (1.727 m)   Wt 142 lb (64.4 kg)   BMI 21.59 kg/m      Assessment & Plan:  1. Hospital discharge follow-up - CMP14+EGFR - CBC with Differential/Platelet  2. Nauseous -  Ambulatory referral to Gastroenterology - CMP14+EGFR - CBC with Differential/Platelet  3. Decreased appetite - Ambulatory referral to Gastroenterology - CMP14+EGFR - CBC with Differential/Platelet  4. Dysphagia, unspecified type - Ambulatory referral to Gastroenterology - CMP14+EGFR - CBC with Differential/Platelet  5. Aspiration pneumonia, unspecified aspiration pneumonia type, unspecified laterality, unspecified part of lung ([place]) - CMP14+EGFR - CBC with Differential/Platelet - DG Chest 2 View; Future   Force fluids Continue zofran RTO prn and keep chronic follow up with PCP   Evelina Dun, FNP

## 2016-11-13 NOTE — Patient Instructions (Addendum)
Nausea, Adult  Nausea is the feeling of an upset stomach or having to vomit. Nausea on its own is not usually a serious concern, but it may be an early sign of a more serious medical problem. As nausea gets worse, it can lead to vomiting. If vomiting develops, or if you are not able to drink enough fluids, you are at risk of becoming dehydrated. Dehydration can make you tired and thirsty, cause you to have a dry mouth, and decrease how often you urinate. Older adults and people with other diseases or a weak immune system are at higher risk for dehydration. The main goals of treating your nausea are:  · To limit repeated nausea episodes.  · To prevent vomiting and dehydration.    Follow these instructions at home:  Follow instructions from your health care provider about how to care for yourself at home.  Eating and drinking   Follow these recommendations as told by your health care provider:  · Take an oral rehydration solution (ORS). This is a drink that is sold at pharmacies and retail stores.  · Drink clear fluids in small amounts as you are able. Clear fluids include water, ice chips, diluted fruit juice, and low-calorie sports drinks.  · Eat bland, easy-to-digest foods in small amounts as you are able. These foods include bananas, applesauce, rice, lean meats, toast, and crackers.  · Avoid drinking fluids that contain a lot of sugar or caffeine, such as energy drinks, sports drinks, and soda.  · Avoid alcohol.  · Avoid spicy or fatty foods.    General instructions   · Drink enough fluid to keep your urine clear or pale yellow.  · Wash your hands often. If soap and water are not available, use hand sanitizer.  · Make sure that all people in your household wash their hands well and often.  · Rest at home while you recover.  · Take over-the-counter and prescription medicines only as told by your health care provider.  · Breathe slowly and deeply when you feel nauseous.  · Watch your condition for any  changes.  · Keep all follow-up visits as told by your health care provider. This is important.  Contact a health care provider if:  · You have a headache.  · You have new symptoms.  · Your nausea gets worse.  · You have a fever.  · You feel light-headed or dizzy.  · You vomit.  · You cannot keep fluids down.  Get help right away if:  · You have pain in your chest, neck, arm, or jaw.  · You feel extremely weak or you faint.  · You have vomit that is bright red or looks like coffee grounds.  · You have bloody or black stools or stools that look like tar.  · You have a severe headache, a stiff neck, or both.  · You have severe pain, cramping, or bloating in your abdomen.  · You have a rash.  · You have difficulty breathing or are breathing very quickly.  · Your heart is beating very quickly.  · Your skin feels cold and clammy.  · You feel confused.  · You have pain when you urinate.  · You have signs of dehydration, such as:  ? Dark urine, very little, or no urine.  ? Cracked lips.  ? Dry mouth.  ? Sunken eyes.  ? Sleepiness.  ? Weakness.  These symptoms may represent a serious problem that is an emergency. Do   not wait to see if the symptoms will go away. Get medical help right away. Call your local emergency services (911 in the [place] Do not drive yourself to the hospital.  This information is not intended to replace advice given to you by your health care provider. Make sure you discuss any questions you have with your health care provider.  Document Released: 08/13/2004 Document Revised: 12/09/2015 Document Reviewed: 03/12/2015  Elsevier Interactive Patient Education © 2017 Elsevier Inc.

## 2016-11-14 LAB — CBC WITH DIFFERENTIAL/PLATELET
Basophils Absolute: 0.1 10*3/uL (ref 0.0–0.2)
Basos: 1 %
EOS (ABSOLUTE): 0.5 10*3/uL — AB (ref 0.0–0.4)
Eos: 8 %
HEMATOCRIT: 36.8 % (ref 34.0–46.6)
Hemoglobin: 12.1 g/dL (ref 11.1–15.9)
IMMATURE GRANS (ABS): 0 10*3/uL (ref 0.0–0.1)
IMMATURE GRANULOCYTES: 0 %
Lymphocytes Absolute: 1.4 10*3/uL (ref 0.7–3.1)
Lymphs: 23 %
MCH: 29.2 pg (ref 26.6–33.0)
MCHC: 32.9 g/dL (ref 31.5–35.7)
MCV: 89 fL (ref 79–97)
Monocytes Absolute: 0.6 10*3/uL (ref 0.1–0.9)
Monocytes: 11 %
NEUTROS PCT: 57 %
Neutrophils Absolute: 3.4 10*3/uL (ref 1.4–7.0)
Platelets: 320 10*3/uL (ref 150–379)
RBC: 4.15 x10E6/uL (ref 3.77–5.28)
RDW: 15.7 % — ABNORMAL HIGH (ref 12.3–15.4)
WBC: 5.9 10*3/uL (ref 3.4–10.8)

## 2016-11-14 LAB — CMP14+EGFR
ALBUMIN: 4.3 g/dL (ref 3.5–5.5)
ALK PHOS: 85 IU/L (ref 39–117)
ALT: 20 IU/L (ref 0–32)
AST: 27 IU/L (ref 0–40)
Albumin/Globulin Ratio: 2 (ref 1.2–2.2)
BUN / CREAT RATIO: 17 (ref 9–23)
BUN: 19 mg/dL (ref 6–24)
Bilirubin Total: 0.3 mg/dL (ref 0.0–1.2)
CHLORIDE: 100 mmol/L (ref 96–106)
CO2: 23 mmol/L (ref 18–29)
CREATININE: 1.09 mg/dL — AB (ref 0.57–1.00)
Calcium: 9.9 mg/dL (ref 8.7–10.2)
GFR calc Af Amer: 68 mL/min/{1.73_m2} (ref >59)
GFR calc non Af Amer: 59 mL/min/{1.73_m2} — ABNORMAL LOW (ref >59)
GLOBULIN, TOTAL: 2.1 g/dL (ref 1.5–4.5)
GLUCOSE: 95 mg/dL (ref 65–99)
Potassium: 3.9 mmol/L (ref 3.5–5.2)
SODIUM: 141 mmol/L (ref 134–144)
Total Protein: 6.4 g/dL (ref 6.0–8.5)

## 2016-11-16 ENCOUNTER — Encounter: Payer: Self-pay | Admitting: Internal Medicine

## 2016-11-23 ENCOUNTER — Telehealth: Payer: Self-pay | Admitting: *Deleted

## 2016-11-23 NOTE — Telephone Encounter (Signed)
Aware, chest x-ray shows no pneumonia and labs are ok.

## 2016-11-26 ENCOUNTER — Encounter: Payer: Self-pay | Admitting: Family

## 2016-11-26 ENCOUNTER — Ambulatory Visit (INDEPENDENT_AMBULATORY_CARE_PROVIDER_SITE_OTHER): Payer: PRIVATE HEALTH INSURANCE | Admitting: Family

## 2016-11-26 VITALS — BP 93/62 | HR 68 | Temp 97.0°F

## 2016-11-26 DIAGNOSIS — I10 Essential (primary) hypertension: Secondary | ICD-10-CM

## 2016-11-26 DIAGNOSIS — I952 Hypotension due to drugs: Secondary | ICD-10-CM | POA: Diagnosis not present

## 2016-11-26 DIAGNOSIS — J4530 Mild persistent asthma, uncomplicated: Secondary | ICD-10-CM

## 2016-11-26 MED ORDER — FLUTICASONE-SALMETEROL 230-21 MCG/ACT IN AERO: 2.0000 | INHALATION_SPRAY | Freq: Two times a day (BID) | RESPIRATORY_TRACT | 12 refills | Status: AC

## 2016-11-26 MED ORDER — LABETALOL HCL 100 MG PO TABS: 50.0000 mg | ORAL_TABLET | Freq: Two times a day (BID) | ORAL | 11 refills | Status: DC

## 2016-11-26 NOTE — Patient Instructions (Signed)
Hypotension  Hypotension, commonly called low blood pressure, is when the force of blood pumping through your arteries is too weak. Arteries are blood vessels that carry blood from the heart throughout the body. When blood pressure is too low, you may not get enough blood to your brain or to the rest of your organs. This can cause weakness, light-headedness, rapid heartbeat, and fainting.  Depending on the cause and severity, hypotension may be harmless (benign) or cause serious problems (critical).  What are the causes?  Possible causes of hypotension include:  · Blood loss.  · Loss of body fluids (dehydration).  · Heart problems.  · Hormone (endocrine) problems.  · Pregnancy.  · Severe infection.  · Lack of certain nutrients.  · Severe allergic reactions (anaphylaxis).  · Certain medicines, such as blood pressure medicine or medicines that make the body lose excess fluids (diuretics). Sometimes, hypotension can be caused by not taking medicine as directed, such as taking too much of a certain medicine.    What increases the risk?  Certain factors can make you more likely to develop hypotension, including:  · Age. Risk increases as you get older.  · Conditions that affect the heart or the central nervous system.  · Taking certain medicines, such as blood pressure medicine or diuretics.  · Being pregnant.    What are the signs or symptoms?  Symptoms of this condition may include:  · Weakness.  · Light-headedness.  · Dizziness.  · Blurred vision.  · Fatigue.  · Rapid heartbeat.  · Fainting, in severe cases.    How is this diagnosed?  This condition is diagnosed based on:  · Your medical history.  · Your symptoms.  · Your blood pressure measurement. Your health care provider will check your blood pressure when you are:  ? Lying down.  ? Sitting.  ? Standing.    A blood pressure reading is recorded as two numbers, such as "120 over 80" (or 120/80). The first ("top") number is called the systolic pressure. It is a  measure of the pressure in your arteries as your heart beats. The second ("bottom") number is called the diastolic pressure. It is a measure of the pressure in your arteries when your heart relaxes between beats. Blood pressure is measured in a unit called mm Hg. Healthy blood pressure for adults is 120/80. If your blood pressure is below 90/60, you may be diagnosed with hypotension.  Other information or tests that may be used to diagnose hypotension include:  · Your other vital signs, such as your heart rate and temperature.  · Blood tests.  · Tilt table test. For this test, you will be safely secured to a table that moves you from a lying position to an upright position. Your heart rhythm and blood pressure will be monitored during the test.    How is this treated?  Treatment for this condition may include:  · Changing your diet. This may involve eating more salt (sodium) or drinking more water.  · Taking medicines to raise your blood pressure.  · Changing the dosage of certain medicines you are taking that might be lowering your blood pressure.  · Wearing compression stockings. These stockings help to prevent blood clots and reduce swelling in your legs.    In some cases, you may need to go to the hospital for:  · Fluid replacement. This means you will receive fluids through an IV tube.  · Blood replacement. This means you will receive   donated blood through an IV tube (transfusion).  · Treating an infection or heart problems, if this applies.  · Monitoring. You may need to be monitored while medicines that you are taking wear off.    Follow these instructions at home:  Eating and drinking     · Drink enough fluid to keep your urine clear or pale yellow.  · Eat a healthy diet and follow instructions from your health care provider about eating or drinking restrictions. A healthy diet includes:  ? Fresh fruits and vegetables.  ? Whole grains.  ? Lean meats.  ? Low-fat dairy products.  · Eat extra salt only as  directed. Do not add extra salt to your diet unless your health care provider told you to do that.  · Eat frequent, small meals.  · Avoid standing up suddenly after eating.  Medicines   · Take over-the-counter and prescription medicines only as told by your health care provider.  ? Follow instructions from your health care provider about changing the dosage of your current medicines, if this applies.  ? Do not stop or adjust any of your medicines on your own.  General instructions   · Wear compression stockings as told by your health care provider.  · Get up slowly from lying down or sitting positions. This gives your blood pressure a chance to adjust.  · Avoid hot showers and excessive heat as directed by your health care provider.  · Return to your normal activities as told by your health care provider. Ask your health care provider what activities are safe for you.  · Do not use any products that contain nicotine or tobacco, such as cigarettes and e-cigarettes. If you need help quitting, ask your health care provider.  · Keep all follow-up visits as told by your health care provider. This is important.  Contact a health care provider if:  · You vomit.  · You have diarrhea.  · You have a fever for more than 2-3 days.  · You feel more thirsty than usual.  · You feel weak and tired.  Get help right away if:  · You have chest pain.  · You have a fast or irregular heartbeat.  · You develop numbness in any part of your body.  · You cannot move your arms or your legs.  · You have trouble speaking.  · You become sweaty or feel light-headed.  · You faint.  · You feel short of breath.  · You have trouble staying awake.  · You feel confused.  This information is not intended to replace advice given to you by your health care provider. Make sure you discuss any questions you have with your health care provider.  Document Released: 07/06/2005 Document Revised: 01/24/2016 Document Reviewed: 12/26/2015  Elsevier Interactive  Patient Education © 2017 Elsevier Inc.

## 2016-11-26 NOTE — Progress Notes (Addendum)
   Subjective:    Patient ID: Deborah Meyer, female    DOB: [birth year], 51 y.o.   MRN: [id number]   Pt presents to the office today with hypotension. Pt currently taking aliskiren 150 mg and labetol 100 mg BID, however she has not taken either one of these this morning. She states she has a rx for valium and Norco but has not had either one in several days and only takes as needed.  Shortness of Breath  This is a recurrent problem. The current episode started more than 1 year ago. Associated symptoms include rhinorrhea and wheezing. Pertinent negatives include no sore throat. Her past medical history is significant for asthma.  Asthma  She complains of cough, shortness of breath and wheezing. This is a chronic problem. The current episode started more than 1 year ago. The cough is non-productive. Associated symptoms include rhinorrhea. Pertinent negatives include no sneezing or sore throat. Her symptoms are aggravated by pollen. Her symptoms are alleviated by rest and leukotriene antagonist. She reports moderate improvement on treatment. Her past medical history is significant for asthma.      Review of Systems  HENT: Positive for rhinorrhea. Negative for sneezing and sore throat.   Respiratory: Positive for cough, shortness of breath and wheezing.   All other systems reviewed and are negative.      Objective:   Physical Exam  Constitutional: She is oriented to person, place, and time. She appears well-developed and well-nourished. No distress.  HENT:  Head: Normocephalic.  Eyes: Pupils are equal, round, and reactive to light.  Neck: Normal range of motion. Neck supple. No thyromegaly present.  Cardiovascular: Normal rate, regular rhythm, normal heart sounds and intact distal pulses.   No murmur heard. Pulmonary/Chest: Effort normal. No respiratory distress. She has wheezes.  Abdominal: Soft. Bowel sounds are normal. She exhibits no distension. There is no tenderness.    Musculoskeletal: Normal range of motion. She exhibits no edema or tenderness.  Neurological: She is alert and oriented to person, place, and time. No cranial nerve deficit.  Skin: Skin is warm and dry.  Psychiatric: She has a normal mood and affect. Her behavior is normal. Judgment and thought content normal.  Vitals reviewed.     BP 93/62   Pulse 68   Temp 97 F (36.1 C) (Oral)   SpO2 99%      Assessment & Plan:  1. Mild persistent asthma, unspecified whether complicated Advair increased to 230-21 mcg/act from 115-21 mcg Continue Singulair and Flonase Avoid allergens  - fluticasone-salmeterol (ADVAIR HFA) 230-21 MCG/ACT inhaler; Inhale 2 puffs into the lungs 2 (two) times daily.  Dispense: 1 Inhaler; Refill: 12  2. Essential hypertension - labetalol (NORMODYNE) 100 MG tablet; Take 0.5 tablets (50 mg total) by mouth every 12 (twelve) hours.  Dispense: 30 tablet; Refill: 11  3. Hypotension due to drugs   Pt to stop Aliskiren 150 mg and will decrease Labetalol to 50 mg BID from 100 mg Force fluids Two in weeks to recheck hypotension  Discussed standing and slow positional changes   Evelina Dun, FNP

## 2016-12-07 ENCOUNTER — Ambulatory Visit (INDEPENDENT_AMBULATORY_CARE_PROVIDER_SITE_OTHER): Payer: PRIVATE HEALTH INSURANCE | Admitting: Family Medicine

## 2016-12-07 ENCOUNTER — Encounter: Payer: Self-pay | Admitting: Family Medicine

## 2016-12-07 VITALS — BP 94/55 | HR 68 | Temp 97.2°F

## 2016-12-07 DIAGNOSIS — R413 Other amnesia: Secondary | ICD-10-CM | POA: Diagnosis not present

## 2016-12-07 DIAGNOSIS — I6930 Unspecified sequelae of cerebral infarction: Secondary | ICD-10-CM

## 2016-12-07 DIAGNOSIS — I10 Essential (primary) hypertension: Secondary | ICD-10-CM | POA: Diagnosis not present

## 2016-12-07 MED ORDER — METOPROLOL SUCCINATE ER 25 MG PO TB24: 25.0000 mg | ORAL_TABLET | Freq: Every day | ORAL | 3 refills | Status: DC

## 2016-12-07 NOTE — Patient Instructions (Signed)
Great to see you!  Stop labetaolol and start metoprolol, this will be an easier overall low dose.

## 2016-12-07 NOTE — Progress Notes (Signed)
   HPI  Patient presents today here for follow-up of chronic medical conditions.  Patient states that she's been placed on short-term disability, she's having difficulty getting long-term disability.  She has a recent history of CVA 2, she's having right lower extremity weakness and dysfunction causing inability to walk. She also has confusion being treated by her neurologist with Aricept. Patient states that her right upper extremity strength has improved somewhat.  Patient states that her blood pressure dips occasionally, when he goes low she has severe nausea and has emesis. She had an emesis episode today.  PMH: Smoking status noted ROS: Per HPI  Objective: BP (!) 94/55   Pulse 68   Temp 97.2 F (36.2 C) (Oral)  Gen: NAD, alert, cooperative with exam HEENT: NCAT CV: RRR, good S1/S2, no murmur Resp: CTABL, no wheezes, non-labored Ext: No edema, warm Neuro: Alert and oriented, strength 3/5 on right lower extremity versus 5 left/5 on left lower extremity, strength 3-4/5 on right upper extremity versus 5/5 in left upper extremity MSK AFO on R ankle, knee brace on R knee, in a wheelchair  Assessment and plan:  # Late effects of cerebral ischemic stroke Patient with right lower extremity dysfunction, weakness, and right upper extremity weakness, also with confusion. These are directly and actively being treated by her neurologist, I have requested records today. Stable, patient still is unable to walk.  # Memory loss I believe this is also late effect of her stroke Patient is currently on Aricept from her neurologist, records requested   # Hypertension Blood pressure low today, changing labetalol 50 twice daily to metoprolol succinate 25 mg once daily. Patient has follow-up arranged with multiple physicians coming up, she will follow-up with me in about 2 months.   Meds ordered this encounter  Medications  . Donepezil HCl (ARICEPT PO)    Sig: Take by mouth.  .  metoprolol succinate (TOPROL-XL) 25 MG 24 hr tablet    Sig: Take 1 tablet (25 mg total) by mouth daily.    Dispense:  90 tablet    Refill:  [place], MD [place] 12/07/2016, 5:18 PM

## 2016-12-18 ENCOUNTER — Ambulatory Visit: Payer: PRIVATE HEALTH INSURANCE | Admitting: Internal Medicine

## 2017-01-26 ENCOUNTER — Other Ambulatory Visit: Payer: Self-pay | Admitting: Family Medicine

## 2017-01-27 ENCOUNTER — Encounter: Payer: Self-pay | Admitting: Family Medicine

## 2017-02-16 ENCOUNTER — Ambulatory Visit (INDEPENDENT_AMBULATORY_CARE_PROVIDER_SITE_OTHER): Payer: PRIVATE HEALTH INSURANCE | Admitting: Family Medicine

## 2017-02-16 ENCOUNTER — Telehealth: Payer: Self-pay

## 2017-02-16 ENCOUNTER — Encounter: Payer: Self-pay | Admitting: Family Medicine

## 2017-02-16 VITALS — BP 103/65 | HR 65 | Temp 97.0°F

## 2017-02-16 DIAGNOSIS — E785 Hyperlipidemia, unspecified: Secondary | ICD-10-CM | POA: Diagnosis not present

## 2017-02-16 DIAGNOSIS — I10 Essential (primary) hypertension: Secondary | ICD-10-CM | POA: Diagnosis not present

## 2017-02-16 DIAGNOSIS — I6990 Unspecified sequelae of unspecified cerebrovascular disease: Secondary | ICD-10-CM | POA: Diagnosis not present

## 2017-02-16 MED ORDER — BACLOFEN 20 MG PO TABS: 20.0000 mg | ORAL_TABLET | Freq: Three times a day (TID) | ORAL | 0 refills | Status: DC

## 2017-02-16 MED ORDER — BACLOFEN 20 MG PO TABS: 20.0000 mg | ORAL_TABLET | Freq: Two times a day (BID) | ORAL | 4 refills | Status: DC | PRN

## 2017-02-16 MED ORDER — ATENOLOL 25 MG PO TABS: 25.0000 mg | ORAL_TABLET | Freq: Every day | ORAL | 3 refills | Status: DC

## 2017-02-16 NOTE — Progress Notes (Signed)
   HPI  Patient presents today for follow-up chronic medical conditions.  Patient has had a CVA almost 6 months ago now. This has left her with right lower extremity weakness making it difficult to walk. She also has confusion and memory loss and is being treated by neurology actively. She's also being treated for seizure disorder with Lamictal. Her seizures have improved recently.  Patient requests permanent disability handicap placard, this was provided today.  She states that she is doing better with metoprolol, however she has had lots of hair loss. He feels this is the cause and would like to change.  Patient has a transesophageal echo coming up in August.  She would like baclofen prescription for muscle spasms, she was doing well with 20 mg 1-2 times daily.  PMH: Smoking status noted ROS: Per HPI  Objective: BP 103/65   Pulse 65   Temp (!) 97 F (36.1 C) (Oral)  Gen: NAD, alert, cooperative with exam HEENT: NCAT CV: RRR, good S1/S2, no murmur Resp: CTABL, no wheezes, non-labored Ext: No edema, warm Neuro: Alert and oriented, sitting in wheelchair, brace on the right knee and right ankle. Depression screen [facility] 2/9 02/16/2017 02/16/2017 12/07/2016 11/26/2016 11/13/2016  Decreased Interest 0 0 0 0 0  Down, Depressed, Hopeless 0 0 1 0 0  PHQ - 2 Score 0 0 1 0 0  Altered sleeping - - - - 0  Tired, decreased energy - - - - 0  Change in appetite - - - - 0  Feeling bad or failure about yourself  - - - - 0  Trouble concentrating - - - - 0  Moving slowly or fidgety/restless - - - - 0  Suicidal thoughts - - - - 0  PHQ-9 Score - - - - 0     Assessment and plan:  # Hypertension Doing well with metoprolol, blood pressure is marginally.up  Patient feels hair loss is due to metoprolol, checking TSH Discontinue metoprolol, trial of atenolol  # Late effects of CVA Patient with right lower extremity and right upper extremity weakness. Handicap placard form filled out. Symptoms  stable  # Hyperlipidemia Previous LDL very elevated, recheck LDL continue Lipitor. After review of records her LDL was found to be 53 on 10/28/2016 and another system.   Orders Placed This Encounter  Procedures  . LDL Cholesterol, Direct  . CMP14+EGFR  . TSH    Meds ordered this encounter  Medications  . DISCONTD: baclofen (LIORESAL) 10 MG tablet    Sig: Take 2 tablets by mouth daily.    Refill:  0  . DISCONTD: baclofen (LIORESAL) 20 MG tablet    Sig: Take 1 tablet (20 mg total) by mouth 3 (three) times daily.    Dispense:  30 each    Refill:  0  . atenolol (TENORMIN) 25 MG tablet    Sig: Take 1 tablet (25 mg total) by mouth daily.    Dispense:  90 tablet    Refill:  3  . baclofen (LIORESAL) 20 MG tablet    Sig: Take 1 tablet (20 mg total) by mouth 2 (two) times daily as needed for muscle spasms.    Dispense:  60 tablet    Refill:  [place], MD [place] [facility] 02/16/2017, 5:20 PM

## 2017-02-16 NOTE — Patient Instructions (Signed)
Great to see you!  Lets follow up n 4 month sunless you need [place] sooner.

## 2017-02-16 NOTE — Telephone Encounter (Signed)
Requesting refill for baclofen 10mg - no on medication list. Contacted patient and she states that she is taking it and does need a refill. Please review and advise.

## 2017-02-17 LAB — CMP14+EGFR
A/G RATIO: 2.3 — AB (ref 1.2–2.2)
ALT: 50 IU/L — AB (ref 0–32)
AST: 52 IU/L — ABNORMAL HIGH (ref 0–40)
Albumin: 4.3 g/dL (ref 3.5–5.5)
Alkaline Phosphatase: 468 IU/L — ABNORMAL HIGH (ref 39–117)
BUN/Creatinine Ratio: 14 (ref 9–23)
BUN: 13 mg/dL (ref 6–24)
Bilirubin Total: 0.4 mg/dL (ref 0.0–1.2)
CO2: 24 mmol/L (ref 20–29)
CREATININE: 0.9 mg/dL (ref 0.57–1.00)
Calcium: 9.9 mg/dL (ref 8.7–10.2)
Chloride: 103 mmol/L (ref 96–106)
GFR, EST AFRICAN AMERICAN: 86 mL/min/{1.73_m2} (ref >59)
GFR, EST NON AFRICAN AMERICAN: 74 mL/min/{1.73_m2} (ref >59)
GLOBULIN, TOTAL: 1.9 g/dL (ref 1.5–4.5)
Glucose: 101 mg/dL — ABNORMAL HIGH (ref 65–99)
POTASSIUM: 4.7 mmol/L (ref 3.5–5.2)
SODIUM: 140 mmol/L (ref 134–144)
Total Protein: 6.2 g/dL (ref 6.0–8.5)

## 2017-02-17 LAB — LDL CHOLESTEROL, DIRECT: LDL DIRECT: 90 mg/dL (ref 0–99)

## 2017-02-17 LAB — TSH: TSH: 1.17 u[IU]/mL (ref 0.450–4.500)

## 2017-02-23 ENCOUNTER — Telehealth: Payer: Self-pay | Admitting: Family Medicine

## 2017-02-23 NOTE — Telephone Encounter (Signed)
Pt aware of results and scheduled to see Dr Wendi Snipes 03/04/17 at 3:25.

## 2017-03-04 ENCOUNTER — Other Ambulatory Visit: Payer: Self-pay | Admitting: Family Medicine

## 2017-03-04 ENCOUNTER — Ambulatory Visit (INDEPENDENT_AMBULATORY_CARE_PROVIDER_SITE_OTHER): Payer: PRIVATE HEALTH INSURANCE | Admitting: Family Medicine

## 2017-03-04 VITALS — BP 111/63 | HR 60 | Temp 97.1°F

## 2017-03-04 DIAGNOSIS — R748 Abnormal levels of other serum enzymes: Secondary | ICD-10-CM | POA: Diagnosis not present

## 2017-03-04 MED ORDER — NITROGLYCERIN 0.4 MG SL SUBL: 0.4000 mg | SUBLINGUAL_TABLET | SUBLINGUAL | 1 refills | Status: AC

## 2017-03-04 MED ORDER — CLOPIDOGREL BISULFATE 75 MG PO TABS: 75.0000 mg | ORAL_TABLET | Freq: Every day | ORAL | 1 refills | Status: DC

## 2017-03-04 NOTE — Patient Instructions (Addendum)
Great to see you!  We will start the work up and ask for help from an endocrinologist if labs are persistently elevated and unexplained  We are working on an ultrasound, mammogram, and referral to GI  Please return for a pap smear within a month or so.

## 2017-03-04 NOTE — Progress Notes (Signed)
HPI  Patient presents today for follow-up from previous lab results.  Patient was found to have a drastic change in alkaline phosphatase.  She denies any recent medication changes. Scans multiple reasons and possible etiologies of elevated alkaline phosphatase She is not up-to-date for her mammogram, colonoscopy, or Pap smear. She does have a history of abnormal Pap smear, the previous Pap smear was about 8 years ago. She reports history of benign breast tumors  She's had multiple scans recently including MRI and CT of the brain which were normal. 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  CT abd April of this year showed hepatomegaly  PMH: Smoking status noted ROS: Per HPI  Objective: BP 111/63   Pulse 60   Temp (!) 97.1 F (36.2 C) (Oral)  Gen: NAD, alert, cooperative with exam, seen in a wheelchair HEENT: NCAT CV: RRR, good S1/S2, no murmur Resp: CTABL, no wheezes, non-labored Ext: Right knee brace, AFO on the right Neuro: Alert and oriented, No gross deficits  Assessment and plan:  # Elevated alkaline phosphatase Unclear etiology Given her complex history there are multiple possibilities. We have had a discussion about possible recurrence of cancer, however I think this is unlikely given her recent scans. She did have hepatomegaly and her liver enzymes were slightly elevated, we repeated labs today Ultrasound of the abdomen to assess the liver specifically Consider antimitochondrial antibody, consider referral to endocrinologist for consideration of complete picture Referred for mammogram, colonoscopy She will return for Pap smear   Patient was p placed on Brilinta after Plavix failure, she has found out today that it costs $300 and she is unable to afford it. We have placed her back on Plavix, she has an aspirin allergy. Although she has failed this before with her history think it's very important that she be on  some sort of antiplatelet that is covered by insurance. She will call neurology and cardiology to see if they have samples for other ideas.    Orders Placed This Encounter  Procedures  . US Abdomen Complete    Standing Status:   Future    Standing Expiration Date:   05/04/2018    Scheduling Instructions:     [pt requesting [facility] in [place] [employer] Specific Question:   Reason for Exam (SYMPTOM  OR DIAGNOSIS REQUIRED)    Answer:   elevated alk phos, hepatomegaly    Order Specific Question:   Preferred imaging location?    Answer:   External    Order Specific Question:   Call Results- Best Contact Number?    Answer:   [phone]  . MM Digital Screening    Requesting [facility]    Standing Status:   Future    Standing Expiration Date:   05/04/2018    Order Specific Question:   Reason for Exam (SYMPTOM  OR DIAGNOSIS REQUIRED)    Answer:   Scfreening    Order Specific Question:   Is the patient pregnant?    Answer:   No    Order Specific Question:   Preferred imaging location?    Answer:   External  . CMP14+EGFR  . Gamma GT  . Ambulatory referral to Gastroenterology    Referral Priority:   Routine    Referral Type:   Consultation    Referral Reason:   Specialty Services Required    Number of Visits Requested:   1    Meds ordered  this encounter  Medications  . clopidogrel (PLAVIX) 75 MG tablet    Sig: Take 1 tablet (75 mg total) by mouth daily.    Dispense:  30 tablet    Refill:  1  . nitroGLYCERIN (NITROSTAT) 0.4 MG SL tablet    Sig: Place 1 tablet (0.4 mg total) under the tongue every 5 (five) minutes.    Dispense:  30 tablet    Refill:  [place], MD [facility] 03/04/2017, 5:27 PM

## 2017-03-05 LAB — CMP14+EGFR
A/G RATIO: 2.4 — AB (ref 1.2–2.2)
ALBUMIN: 4.4 g/dL (ref 3.5–5.5)
ALT: 38 IU/L — AB (ref 0–32)
AST: 45 IU/L — ABNORMAL HIGH (ref 0–40)
Alkaline Phosphatase: 363 IU/L — ABNORMAL HIGH (ref 39–117)
BILIRUBIN TOTAL: 0.4 mg/dL (ref 0.0–1.2)
BUN / CREAT RATIO: 17 (ref 9–23)
BUN: 15 mg/dL (ref 6–24)
CALCIUM: 9.4 mg/dL (ref 8.7–10.2)
CHLORIDE: 105 mmol/L (ref 96–106)
CO2: 21 mmol/L (ref 20–29)
Creatinine, Ser: 0.9 mg/dL (ref 0.57–1.00)
GFR, EST AFRICAN AMERICAN: 86 mL/min/{1.73_m2} (ref >59)
GFR, EST NON AFRICAN AMERICAN: 74 mL/min/{1.73_m2} (ref >59)
Globulin, Total: 1.8 g/dL (ref 1.5–4.5)
Glucose: 90 mg/dL (ref 65–99)
Potassium: 4 mmol/L (ref 3.5–5.2)
Sodium: 142 mmol/L (ref 134–144)
TOTAL PROTEIN: 6.2 g/dL (ref 6.0–8.5)

## 2017-03-05 LAB — GAMMA GT: GGT: 216 IU/L — ABNORMAL HIGH (ref 0–60)

## 2017-04-09 ENCOUNTER — Ambulatory Visit (INDEPENDENT_AMBULATORY_CARE_PROVIDER_SITE_OTHER): Payer: PRIVATE HEALTH INSURANCE | Admitting: Family Medicine

## 2017-04-09 ENCOUNTER — Encounter: Payer: Self-pay | Admitting: Family Medicine

## 2017-04-09 VITALS — BP 104/62 | HR 66 | Temp 96.9°F | Ht 68.0 in | Wt 144.0 lb

## 2017-04-09 DIAGNOSIS — G40909 Epilepsy, unspecified, not intractable, without status epilepticus: Secondary | ICD-10-CM | POA: Diagnosis not present

## 2017-04-09 DIAGNOSIS — G709 Myoneural disorder, unspecified: Secondary | ICD-10-CM | POA: Diagnosis not present

## 2017-04-09 DIAGNOSIS — R413 Other amnesia: Secondary | ICD-10-CM

## 2017-04-09 DIAGNOSIS — I6990 Unspecified sequelae of unspecified cerebrovascular disease: Secondary | ICD-10-CM | POA: Diagnosis not present

## 2017-04-09 NOTE — Patient Instructions (Signed)
Great to see you!   

## 2017-04-09 NOTE — Progress Notes (Signed)
   HPI  Patient presents today here to discuss disability.  Patient states that she's been granted permanent disability by social security services, she has recently received paperwork from [employer] recertify her disability.  Patient states that she is disabled after a stroke in February 2018. 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  PMH: Smoking status noted ROS: Per HPI  Objective: BP 104/62   Pulse 66   Temp (!) 96.9 F (36.1 C) (Oral)   Ht '5\' 8"'$  (1.727 m)   Wt 144 lb (65.3 kg)   BMI 21.90 kg/m  Gen: NAD, alert, cooperative with exam HEENT: NCAT Ext: No edema, warm Neuro: Alert and oriented, sitting in wheelchair Knee brace on R knee, AFO on R heel  Assessment and plan:  # Late effects of CVA, Epilepsy, memory loss, neuromuscular Disorder Patients symptoms are stable today, she doe have some new concerns for serious liver process with extremely elevated alk phos recently, we have discussed risks of holding brilinta for liver bx and she is willing to proceed.  Disability paperwork reviewed carefullly and filled out, Sever functional capacity limititation, moderate mental impairment - expected to be permanent.    Laroy Apple, MD [facility] 04/10/2017, 8:42 PM

## 2017-04-10 DIAGNOSIS — G40909 Epilepsy, unspecified, not intractable, without status epilepticus: Secondary | ICD-10-CM | POA: Insufficient documentation

## 2017-05-08 ENCOUNTER — Other Ambulatory Visit: Payer: Self-pay | Admitting: Family Medicine

## 2017-05-18 ENCOUNTER — Encounter: Payer: Self-pay | Admitting: *Deleted

## 2017-06-16 ENCOUNTER — Ambulatory Visit (INDEPENDENT_AMBULATORY_CARE_PROVIDER_SITE_OTHER): Payer: PRIVATE HEALTH INSURANCE | Admitting: Family Medicine

## 2017-06-16 ENCOUNTER — Encounter: Payer: Self-pay | Admitting: Family Medicine

## 2017-06-16 VITALS — BP 111/67 | HR 61 | Temp 97.2°F

## 2017-06-16 DIAGNOSIS — G479 Sleep disorder, unspecified: Secondary | ICD-10-CM | POA: Diagnosis not present

## 2017-06-16 DIAGNOSIS — J441 Chronic obstructive pulmonary disease with (acute) exacerbation: Secondary | ICD-10-CM

## 2017-06-16 DIAGNOSIS — R413 Other amnesia: Secondary | ICD-10-CM | POA: Diagnosis not present

## 2017-06-16 MED ORDER — RAMELTEON 8 MG PO TABS: 8.0000 mg | ORAL_TABLET | Freq: Every day | ORAL | 3 refills | Status: DC

## 2017-06-16 MED ORDER — AZITHROMYCIN 250 MG PO TABS: ORAL_TABLET | ORAL | 0 refills | Status: DC

## 2017-06-16 NOTE — Patient Instructions (Signed)
Great to see you!  Try ramelteon 1 pill once daily at night 30 minutes before sleep.   I have also sent a z pack for your cough and shortness of breath.    Chronic Obstructive Pulmonary Disease Exacerbation Chronic obstructive pulmonary disease (COPD) is a common lung problem. In COPD, the flow of air from the lungs is limited. COPD exacerbations are times that breathing gets worse and you need extra treatment. Without treatment they can be life threatening. If they happen often, your lungs can become more damaged. If your COPD gets worse, your doctor may treat you with:  Medicines.  Oxygen.  Different ways to clear your airway, such as using a mask.  Follow these instructions at home:  Do not smoke.  Avoid tobacco smoke and other things that bother your lungs.  If given, take your antibiotic medicine as told. Finish the medicine even if you start to feel better.  Only take medicines as told by your doctor.  Drink enough fluids to keep your pee (urine) clear or pale yellow (unless your doctor has told you not to).  Use a cool mist machine (vaporizer).  If you use oxygen or a machine that turns liquid medicine into a mist (nebulizer), continue to use them as told.  Keep up with shots (vaccinations) as told by your doctor.  Exercise regularly.  Eat healthy foods.  Keep all doctor visits as told. Get help right away if:  You are very short of breath and it gets worse.  You have trouble talking.  You have bad chest pain.  You have blood in your spit (sputum).  You have a fever.  You keep throwing up (vomiting).  You feel weak, or you pass out (faint).  You feel confused.  You keep getting worse. This information is not intended to replace advice given to you by your health care provider. Make sure you discuss any questions you have with your health care provider. Document Released: 06/25/2011 Document Revised: 12/12/2015 Document Reviewed: 03/10/2013 Elsevier  Interactive Patient Education  2017 [employer].

## 2017-06-16 NOTE — Progress Notes (Signed)
HPI  Patient presents today for follow-up of chronic medical problems as well as shortness of breath.  Patient states that she feels her COPD has been flared up lately, she has had increased shortness of breath for about a month but over the last 4-5 days has had increased shortness of breath significantly.  Also increased cough. She is taking all of her controller inhalers as prescribed. She denies fever, chills, sweats, or difficulty tolerating foods and fluids.  She has trouble sleeping long-term, this started after her stroke.  She states that at times she goes up to 2 days without sleep without fatigue. Trazodone works most the time, however on many nights it does not. She uses Nuvigil in the morning, this not the problem in her opinion. She uses Klonopin at night prescribed by her neurologist.  This does not cause sedation for her.  We also reviewed her recent cardiology notes, and updated her history as it pertains to her liver abnormalities.   Memory loss Patient states that she has had some memory loss since her stroke, however seems to be getting worse.  She was started on Aricept by neurology. She has a follow-up with them in a few days. There are no new discrete neurologic symptoms, she does seem to have persistent and possibly worsening slurred speech.  PMH: Smoking status noted ROS: Per HPI  Objective: BP 111/67   Pulse 61   Temp (!) 97.2 F (36.2 C) (Oral)   SpO2 98%  Gen: NAD, alert, cooperative with exam HEENT: NCAT CV: RRR, good S1/S2, no murmur Resp: Nonlabored, good air movement, persistent coarse breath sounds in the left base Abd: SNTND, BS present, no guarding or organomegaly Ext: No edema, warm Neuro: Alert and oriented, sitting in a wheelchair, no appreciable slurred speech  Depression screen [facility] 2/9 06/16/2017 04/09/2017 03/04/2017 02/16/2017 02/16/2017  Decreased Interest 0 0 0 0 0  Down, Depressed, Hopeless 1 0 0 0 0  PHQ - 2 Score 1 0 0 0 0    Altered sleeping - - - - -  Tired, decreased energy - - - - -  Change in appetite - - - - -  Feeling bad or failure about yourself  - - - - -  Trouble concentrating - - - - -  Moving slowly or fidgety/restless - - - - -  Suicidal thoughts - - - - -  PHQ-9 Score - - - - -     Assessment and plan:  #Difficulty sleeping Trial of ramelteon, patient will try melatonin first. 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 Continue Versed Patient has close follow-up with neurology scheduled, no changes for now   Meds ordered this encounter  Medications  . ramelteon (ROZEREM) 8 MG tablet    Sig: Take 1 tablet (8 mg total) by mouth at bedtime.    Dispense:  30 tablet    Refill:  3  . azithromycin (ZITHROMAX) 250 MG tablet    Sig: Take 2 tablets on day 1 and 1  tablet daily after that    Dispense:  6 tablet    Refill:  [place], MD [facility] 06/16/2017, 5:19 PM

## 2017-06-17 ENCOUNTER — Ambulatory Visit: Payer: PRIVATE HEALTH INSURANCE | Admitting: Family Medicine

## 2017-06-30 ENCOUNTER — Telehealth: Payer: Self-pay | Admitting: Family Medicine

## 2017-07-01 NOTE — Telephone Encounter (Signed)
Patient aware and verbalizes understanding. 

## 2017-07-01 NOTE — Telephone Encounter (Signed)
Ok to stop brilinta X 5 days before procedure and restart following day. This wil increase her risk of stroke temporarily but as the EGD is medically necessary is a risk she will likely procede with.    Laroy Apple, MD [facility] 07/01/2017, 7:55 AM

## 2017-07-05 NOTE — Progress Notes (Signed)
Deborah Meyer is a 51 y.o. female presents to office today for annual physical exam examination.    Concerns today include: 1. Recent COPD exacerbation/ Concern for pneumonia Patient treated for COPD exacerbation on 06/16/2017.  There was some physical exam findings suggestive of possible left lower lobe infiltrate.  Patient is status post treatment.  She continues to have significant cough and shortness of breath.  She had a sleep study performed on 06/25/2017 and has pulmonology follow-up on 07/16/2017.  That she self administers Depo-Medrol at home for known muscular disorder.  She is actually due today for her IM injection.  She reports home oxygen fluctuates between low 90s to 98% on room air.  She does report that when she is ambulating or exerting herself in any way, she is short of breath and her pulse ox will decline.  Denies hemoptysis, productive cough, fevers, chills, vomiting.  She has chronic nausea at baseline.  Marital status: married, Substance use: none Last colonoscopy: currently seeing GI. Last mammogram: 2018 Last pap smear: >3 years.  She has h/o abnormal in the past. Refills needed today: none Immunizations needed: Flu Vaccine: yes   Past Medical History:  Diagnosis Date  . Asthma   . COPD (chronic obstructive pulmonary disease) ([place])   . Dyspnea on exertion   . Epilepsy with partial complex seizures ([place])   . Heart attack ([place])   . Heart murmur   . Hypertension   . Low iron    hx of iron supplementation  . Migraine   . Neuromuscular disorder ([place])    frequent falls  . Neuromuscular disorder ([place])   . Normal cardiac stress test 02/2014   low risk stress echo  . Osteoma    craniotomy 2006  . Progressive multifocal leukoencephalopathy   . Sleep apnea   . Stroke ([place])   . Thiamin deficiency   . Vitamin D deficiency    Social History   Socioeconomic History  . Marital status: Married    Spouse name: Not on file  . Number of children: Not on file    . Years of education: Not on file  . Highest education level: Not on file  Social Needs  . Financial resource strain: Not on file  . Food insecurity - worry: Not on file  . Food insecurity - inability: Not on file  . Transportation needs - medical: Not on file  . Transportation needs - non-medical: Not on file  Occupational History  . Not on file  Tobacco Use  . Smoking status: Never Smoker  . Smokeless tobacco: Never Used  Substance and Sexual Activity  . Alcohol use: No  . Drug use: No  . Sexual activity: Yes  Other Topics Concern  . Not on file  Social History Narrative  . Not on file   Past Surgical History:  Procedure Laterality Date  . breast tumors removed    . CRANIOTOMY    . LOOP RECORDER IMPLANT  2017, july  . MUSCLE BIOPSY    . OSTEOTOMY    . TUBAL LIGATION     Family History  Problem Relation Age of Onset  . Diabetes Mother   . Hypertension Mother   . Stroke Mother   . Heart disease Mother   . Heart disease Father   . Stroke Father   . Depression Father   . Alzheimer's disease Father   . Diabetes Sister   . Diabetes Brother   . Hyperlipidemia Brother  Current Outpatient Medications:  .  albuterol (PROVENTIL HFA;VENTOLIN HFA) 108 (90 BASE) MCG/ACT inhaler, Inhale 2 puffs into the lungs every 6 (six) hours as needed for wheezing., Disp: , Rfl:  .  Armodafinil (NUVIGIL) 250 MG tablet, Take 250 mg by mouth daily., Disp: , Rfl:  .  atenolol (TENORMIN) 25 MG tablet, Take 1 tablet (25 mg total) by mouth daily., Disp: 90 tablet, Rfl: 3 .  atorvastatin (LIPITOR) 40 MG tablet, Take 1 Tablet by mouth once daily, Disp: 90 tablet, Rfl: 0 .  azithromycin (ZITHROMAX) 250 MG tablet, Take 2 tablets on day 1 and 1 tablet daily after that, Disp: 6 tablet, Rfl: 0 .  baclofen (LIORESAL) 20 MG tablet, Take 1 tablet (20 mg total) by mouth 2 (two) times daily as needed for muscle spasms., Disp: 60 tablet, Rfl: 4 .  BRILINTA 90 MG TABS tablet, Take 1 Tablet by mouth 2  times a day, Disp: 60 tablet, Rfl: 11 .  Coenzyme Q10 50 MG CAPS, Take by mouth., Disp: , Rfl:  .  cyanocobalamin (,VITAMIN B-12,) 1000 MCG/ML injection, Inject 1,000 mcg into the muscle once a week. On Sunday., Disp: , Rfl:  .  diazepam (VALIUM) 5 MG tablet, Take 1 tablet by mouth every 6 (six) hours., Disp: , Rfl: 0 .  Donepezil HCl (ARICEPT PO), Take by mouth., Disp: , Rfl:  .  EPINEPHrine 0.3 mg/0.3 mL IJ SOAJ injection, inject 0.3 ML into THE muscle ONCE as needed FOR anaphylaxis, Disp: , Rfl: 0 .  fluticasone (FLONASE) 50 MCG/ACT nasal spray, Place 1 spray into both nostrils daily., Disp: , Rfl:  .  fluticasone-salmeterol (ADVAIR HFA) 230-21 MCG/ACT inhaler, Inhale 2 puffs into the lungs 2 (two) times daily., Disp: 1 Inhaler, Rfl: 12 .  HYDROcodone-acetaminophen (NORCO/VICODIN) 5-325 MG per tablet, Take 1 tablet by mouth every 6 (six) hours as needed for pain., Disp: , Rfl:  .  ibandronate (BONIVA) 150 MG tablet, Take 1 tablet by mouth every 30 (thirty) days., Disp: , Rfl:  .  lamoTRIgine (LAMICTAL) 100 MG tablet, Take 200 mg by mouth 2 (two) times daily. , Disp: , Rfl: 1 .  MethylPREDNISolone Acetate (DEPO-MEDROL IJ), Inject 10 mg as directed every 3 (three) days., Disp: , Rfl:  .  montelukast (SINGULAIR) 10 MG tablet, Take 10 mg by mouth at bedtime., Disp: , Rfl:  .  nitroGLYCERIN (NITROSTAT) 0.4 MG SL tablet, Place 1 tablet (0.4 mg total) under the tongue every 5 (five) minutes., Disp: 30 tablet, Rfl: 1 .  Omega-3 Fatty Acids (FISH OIL) 1000 MG CAPS, Take 1,000 mg by mouth 2 (two) times daily., Disp: , Rfl:  .  ondansetron (ZOFRAN) 8 MG tablet, Take 8 mg by mouth every 8 (eight) hours as needed. for nausea, Disp: , Rfl: 0 .  ramelteon (ROZEREM) 8 MG tablet, Take 1 tablet (8 mg total) by mouth at bedtime., Disp: 30 tablet, Rfl: 3 .  thiamine 100 MG tablet, Take by mouth., Disp: , Rfl:  .  vitamin E 400 UNIT capsule, Take 400 Units by mouth daily., Disp: , Rfl:    ROS: Review of  Systems Constitutional: negative Eyes: positive for contacts/glasses Ears, nose, mouth, throat, and face: positive for chronic dysphagia Respiratory: positive for asthma, cough, dyspnea on exertion, wheezing and COPD Cardiovascular: negative Gastrointestinal: positive for nausea Genitourinary:negative, perimenopausal.  LMP 08/2016. no abnormal vaginal discharge/ bleeding. no itching Integument/breast: negative, h/o abnormal mammograms Hematologic/lymphatic: negative Musculoskeletal:positive for RLE weakness and b/l UE weakness, has h/o muscular disorder  and CVA Neurological: weakness as above.  occ diff w/ speech and memory since CVA Behavioral/Psych: negative Endocrine: negative Allergic/Immunologic: positive for ashtma    Physical exam BP 111/68   Pulse 70   Temp (!) 97.5 F (36.4 C) (Other (Comment))   Ht _0  (1.727 m)   SpO2 98%   BMI 21.90 kg/m  General appearance: alert, cooperative, appears stated age and arrives in wheelchair Head: Normocephalic, without obvious abnormality, atraumatic Eyes: negative findings: lids and lashes normal, conjunctivae and sclerae normal, corneas clear and pupils equal, round, reactive to light and accomodation Ears: normal TM's and external ear canals both ears Nose: Nares normal. Septum midline. Mucosa normal. No drainage or sinus tenderness. Throat: lips, mucosa, and tongue normal; teeth and gums normal Neck: no adenopathy, supple, symmetrical, trachea midline and thyroid not enlarged, symmetric, no tenderness/mass/nodules Back: symmetric, no curvature. ROM normal. No CVA tenderness. Lungs: Fair air movement.  No wheezes, rhonchi or rales appreciated.  Pulse ox on room air after exertion was initially 87%.  This improved to 98% after being allowed to rest. Breasts: normal appearance, no masses or tenderness Heart: regular rate and rhythm, S1, S2 normal, no murmur, click, rub or gallop Abdomen: soft, non-tender; bowel sounds normal; no  masses,  no organomegaly Pelvic: external genitalia normal, no adnexal masses or tenderness, no cervical motion tenderness, rectovaginal septum normal, uterus normal size, shape, and consistency and Cervix with small hemorrhagic lesion appreciated at the 11 o'clock position.  No masses appreciated within the vaginal vault but there was a palpable polypoid mass about the size of a PE appreciated on the posterior aspect of the vaginal vault. Extremities: Arrives in wheelchair.  Has a brace of the right lower extremity in place. RLE weakness appreciated Pulses: 2+ and symmetric Skin: Skin color, texture, turgor normal. No rashes or lesions Lymph nodes: Cervical, supraclavicular, and axillary nodes normal. Neurologic: Right lower extremity weakness.  She has bilateral upper extremity decreased strength.  Follows all commands.  Depression screen [facility] 2/9 07/06/2017 06/16/2017 04/09/2017  Decreased Interest 0 0 0  Down, Depressed, Hopeless 0 1 0  PHQ - 2 Score 0 1 0  Altered sleeping - - -  Tired, decreased energy - - -  Change in appetite - - -  Feeling bad or failure about yourself  - - -  Trouble concentrating - - -  Moving slowly or fidgety/restless - - -  Suicidal thoughts - - -  PHQ-9 Score - - -   No results found.  Assessment/ Plan: Jodean Valade Pacha here for annual physical exam.   1. Dyspnea on exertion This is been a chronic issue for patient.  She is actually seeing pulmonology for this.  Depo-Medrol injection provided today, as she was due for this.  She self injects every 3 days.  Chest x-ray did not reveal acute pulmonary infiltrates upon my personal review.  I am awaiting formal review by radiologist. - DG Chest 2 View; Future - methylPREDNISolone acetate (DEPO-MEDROL) injection 80 mg - CBC with Differential  2. Women's annual routine gynecological examination  3. Screening for cervical cancer History of abnormal in the past. - Pap IG, CT/NG NAA, and HPV (high risk)  [facility]  4. Vaginal mass Appreciated on the posterior vaginal canal by palpation.  Feels polypoid in nature.  Not well visualized with the speculum as exam was limited by patient's inability to move lower extremities well and because she becomes dyspneic with lying down.  I did review this  finding with the patient and discussed referral to gynecology for further evaluation and excision of lesion to send for biopsy.  She voiced good understanding and had no other questions. - Ambulatory referral to Gynecology  5. History of CVA (cerebrovascular accident) - Lipid Panel - CMP14+EGFR  6. Screening for HIV without presence of risk factors - HIV antibody (with reflex)  Counseled on healthy lifestyle choices, including diet (rich in fruits, vegetables and lean meats and low in salt and simple carbohydrates) and exercise (at least 30 minutes of moderate physical activity daily).  Patient to follow up in 1 year for annual exam or sooner if needed.  Briza Bark M. Lajuana Ripple, DO

## 2017-07-06 ENCOUNTER — Encounter: Payer: Self-pay | Admitting: Family Medicine

## 2017-07-06 ENCOUNTER — Ambulatory Visit (INDEPENDENT_AMBULATORY_CARE_PROVIDER_SITE_OTHER): Payer: PRIVATE HEALTH INSURANCE

## 2017-07-06 ENCOUNTER — Ambulatory Visit (INDEPENDENT_AMBULATORY_CARE_PROVIDER_SITE_OTHER): Payer: PRIVATE HEALTH INSURANCE | Admitting: Family Medicine

## 2017-07-06 ENCOUNTER — Encounter: Payer: PRIVATE HEALTH INSURANCE | Admitting: Family Medicine

## 2017-07-06 VITALS — BP 111/68 | HR 70 | Temp 97.5°F | Ht 68.0 in

## 2017-07-06 DIAGNOSIS — Z8701 Personal history of pneumonia (recurrent): Secondary | ICD-10-CM

## 2017-07-06 DIAGNOSIS — R0609 Other forms of dyspnea: Secondary | ICD-10-CM

## 2017-07-06 DIAGNOSIS — N899 Noninflammatory disorder of vagina, unspecified: Secondary | ICD-10-CM

## 2017-07-06 DIAGNOSIS — Z01419 Encounter for gynecological examination (general) (routine) without abnormal findings: Secondary | ICD-10-CM | POA: Diagnosis not present

## 2017-07-06 DIAGNOSIS — R0902 Hypoxemia: Secondary | ICD-10-CM

## 2017-07-06 DIAGNOSIS — Z124 Encounter for screening for malignant neoplasm of cervix: Secondary | ICD-10-CM | POA: Diagnosis not present

## 2017-07-06 DIAGNOSIS — Z114 Encounter for screening for human immunodeficiency virus [HIV]: Secondary | ICD-10-CM | POA: Diagnosis not present

## 2017-07-06 DIAGNOSIS — Z8673 Personal history of transient ischemic attack (TIA), and cerebral infarction without residual deficits: Secondary | ICD-10-CM | POA: Diagnosis not present

## 2017-07-06 DIAGNOSIS — N898 Other specified noninflammatory disorders of vagina: Secondary | ICD-10-CM

## 2017-07-06 MED ORDER — METHYLPREDNISOLONE ACETATE 80 MG/ML IJ SUSP
80.0000 mg | Freq: Once | INTRAMUSCULAR | Status: AC
  Administered 2017-07-06: 80 mg via INTRAMUSCULAR

## 2017-07-06 NOTE — Patient Instructions (Addendum)
Please make sure to follow-up with your pulmonologist and gastroenterologist as scheduled.  I will contact you with results of your Pap smear once this is available.  This will likely take at least 1 week before the results are available.  Physical exam today was significant for a small palpable vaginal mass.  This may represent a polyp but I am sending you over to gynecology for further evaluation and possible removal.  You are given a dose of Depo-Medrol today in office.  Your chest x-ray did not reveal any findings suggestive of pneumonia on my read.  I am still waiting for a formal read by the radiologist.  I will contact you once this is available.

## 2017-07-07 LAB — CMP14+EGFR
ALT: 44 IU/L — ABNORMAL HIGH (ref 0–32)
AST: 45 IU/L — ABNORMAL HIGH (ref 0–40)
Albumin/Globulin Ratio: 2.2 (ref 1.2–2.2)
Albumin: 4.2 g/dL (ref 3.5–5.5)
Alkaline Phosphatase: 241 IU/L — ABNORMAL HIGH (ref 39–117)
BUN/Creatinine Ratio: 19 (ref 9–23)
BUN: 14 mg/dL (ref 6–24)
Bilirubin Total: 0.4 mg/dL (ref 0.0–1.2)
CO2: 21 mmol/L (ref 20–29)
Calcium: 9.7 mg/dL (ref 8.7–10.2)
Chloride: 106 mmol/L (ref 96–106)
Creatinine, Ser: 0.72 mg/dL (ref 0.57–1.00)
GFR calc Af Amer: 112 mL/min/1.73
GFR calc non Af Amer: 97 mL/min/1.73
Globulin, Total: 1.9 g/dL (ref 1.5–4.5)
Glucose: 76 mg/dL (ref 65–99)
Potassium: 4.1 mmol/L (ref 3.5–5.2)
Sodium: 140 mmol/L (ref 134–144)
Total Protein: 6.1 g/dL (ref 6.0–8.5)

## 2017-07-07 LAB — LIPID PANEL
Chol/HDL Ratio: 1.9 ratio (ref 0.0–4.4)
Cholesterol, Total: 170 mg/dL (ref 100–199)
HDL: 89 mg/dL
LDL Calculated: 70 mg/dL (ref 0–99)
Triglycerides: 56 mg/dL (ref 0–149)
VLDL Cholesterol Cal: 11 mg/dL (ref 5–40)

## 2017-07-07 LAB — CBC WITH DIFFERENTIAL/PLATELET
Basophils Absolute: 0.1 x10E3/uL (ref 0.0–0.2)
Basos: 1 %
EOS (ABSOLUTE): 0.6 x10E3/uL — ABNORMAL HIGH (ref 0.0–0.4)
Eos: 11 %
Hematocrit: 34.4 % (ref 34.0–46.6)
Hemoglobin: 11.4 g/dL (ref 11.1–15.9)
Immature Grans (Abs): 0 x10E3/uL (ref 0.0–0.1)
Immature Granulocytes: 0 %
Lymphocytes Absolute: 1.6 x10E3/uL (ref 0.7–3.1)
Lymphs: 28 %
MCH: 29.8 pg (ref 26.6–33.0)
MCHC: 33.1 g/dL (ref 31.5–35.7)
MCV: 90 fL (ref 79–97)
Monocytes Absolute: 0.6 x10E3/uL (ref 0.1–0.9)
Monocytes: 10 %
Neutrophils Absolute: 2.8 x10E3/uL (ref 1.4–7.0)
Neutrophils: 50 %
Platelets: 261 x10E3/uL (ref 150–379)
RBC: 3.83 x10E6/uL (ref 3.77–5.28)
RDW: 14 % (ref 12.3–15.4)
WBC: 5.7 x10E3/uL (ref 3.4–10.8)

## 2017-07-07 LAB — HIV ANTIBODY (ROUTINE TESTING W REFLEX): HIV Screen 4th Generation wRfx: NONREACTIVE

## 2017-07-09 LAB — PAP IG, CT-NG NAA, HPV HIGH-RISK
CHLAMYDIA, NUC. ACID AMP: NEGATIVE
GONOCOCCUS BY NUCLEIC ACID AMP: NEGATIVE
HPV, HIGH-RISK: NEGATIVE
PAP SMEAR COMMENT: 0

## 2017-08-07 ENCOUNTER — Other Ambulatory Visit: Payer: Self-pay | Admitting: Family Medicine

## 2017-10-06 ENCOUNTER — Other Ambulatory Visit: Payer: Self-pay | Admitting: Family Medicine

## 2017-10-06 NOTE — Telephone Encounter (Signed)
Last seen 07/06/17  Dr Wendi Snipes

## 2017-10-14 ENCOUNTER — Encounter: Payer: Self-pay | Admitting: Family Medicine

## 2017-10-14 ENCOUNTER — Ambulatory Visit (INDEPENDENT_AMBULATORY_CARE_PROVIDER_SITE_OTHER): Payer: Managed Care, Other (non HMO) | Admitting: Family Medicine

## 2017-10-14 VITALS — BP 115/68 | HR 61 | Temp 97.0°F

## 2017-10-14 DIAGNOSIS — J449 Chronic obstructive pulmonary disease, unspecified: Secondary | ICD-10-CM

## 2017-10-14 NOTE — Progress Notes (Signed)
   HPI  Patient presents today here for routine follow up  Pt states that her breathing is getting worse. Recent she states that she has met with pulmonology who states that COPD is getting worse.  She reports exertional dyspnea with oxygen saturations as low as 79%.  She states this is been going on for 3 or 4 months. She states that she has been evaluated with pulmonology since that time with pulse ox with exertion. She reports good med compliance with advair and spiriva  She has loss of appetite and weight loss- reports 50 lb in 6 months.  Is seeing GI in the coming weeks. Prepared for likely C scope.   PMH: Smoking status noted ROS: Per HPI  Objective: BP 115/68   Pulse 61   Temp (!) 97 F (36.1 C) (Oral)   SpO2 99%  Gen: NAD, alert, cooperative with exam HEENT: NCAT CV: RRR, good S1/S2, no murmur Resp: CTABL, no wheezes, non-labored Ext: No edema, warm Neuro: Alert and oriented, sitting in a wheelchair  Assessment and plan:  #COPD Patient is very comfortable on exam, oxygen saturations after wheeling herself into the clinic are 99%. She has a cardiologist, pulmonologist, GI, and endocrinology as well as neurology that all sound like they have been working with exertional dyspnea and related topics over the last 6 weeks and into the next 3-4 weeks. She is on Advair plus Spiriva, no changes She does not feel comfortable to try ambulatory oxygen saturations today I encouraged her to continue close follow up with pulmonology.      Laroy Apple, MD [facility] 10/14/2017, 4:39 PM

## 2017-11-15 ENCOUNTER — Encounter (HOSPITAL_COMMUNITY): Payer: Self-pay | Admitting: Emergency Medicine

## 2017-11-15 ENCOUNTER — Emergency Department (HOSPITAL_COMMUNITY)
Admission: EM | Admit: 2017-11-15 | Discharge: 2017-11-15 | Disposition: A | Discharge status: Home / Self Care | Payer: Managed Care, Other (non HMO) | Attending: Emergency Medicine | Admitting: Emergency Medicine

## 2017-11-15 ENCOUNTER — Ambulatory Visit: Payer: Managed Care, Other (non HMO)

## 2017-11-15 ENCOUNTER — Emergency Department (HOSPITAL_COMMUNITY): Payer: Managed Care, Other (non HMO)

## 2017-11-15 DIAGNOSIS — R4189 Other symptoms and signs involving cognitive functions and awareness: Secondary | ICD-10-CM | POA: Insufficient documentation

## 2017-11-15 DIAGNOSIS — R479 Unspecified speech disturbances: Secondary | ICD-10-CM | POA: Diagnosis not present

## 2017-11-15 DIAGNOSIS — R55 Syncope and collapse: Secondary | ICD-10-CM | POA: Diagnosis present

## 2017-11-15 DIAGNOSIS — J449 Chronic obstructive pulmonary disease, unspecified: Secondary | ICD-10-CM | POA: Diagnosis not present

## 2017-11-15 DIAGNOSIS — I10 Essential (primary) hypertension: Secondary | ICD-10-CM | POA: Diagnosis not present

## 2017-11-15 DIAGNOSIS — N3001 Acute cystitis with hematuria: Secondary | ICD-10-CM | POA: Insufficient documentation

## 2017-11-15 DIAGNOSIS — Z79899 Other long term (current) drug therapy: Secondary | ICD-10-CM | POA: Diagnosis not present

## 2017-11-15 LAB — URINALYSIS, ROUTINE W REFLEX MICROSCOPIC
BILIRUBIN URINE: NEGATIVE
GLUCOSE, UA: NEGATIVE mg/dL
KETONES UR: NEGATIVE mg/dL
NITRITE: NEGATIVE
PH: 6 (ref 5.0–8.0)
Protein, ur: NEGATIVE mg/dL
Specific Gravity, Urine: 1.015 (ref 1.005–1.030)

## 2017-11-15 LAB — CBC
HEMATOCRIT: 34.6 % — AB (ref 36.0–46.0)
HEMOGLOBIN: 10.9 g/dL — AB (ref 12.0–15.0)
MCH: 29.8 pg (ref 26.0–34.0)
MCHC: 31.5 g/dL (ref 30.0–36.0)
MCV: 94.5 fL (ref 78.0–100.0)
Platelets: 222 10*3/uL (ref 150–400)
RBC: 3.66 MIL/uL — AB (ref 3.87–5.11)
RDW: 13.7 % (ref 11.5–15.5)
WBC: 3.6 10*3/uL — ABNORMAL LOW (ref 4.0–10.5)

## 2017-11-15 LAB — BASIC METABOLIC PANEL
ANION GAP: 11 (ref 5–15)
BUN: 15 mg/dL (ref 6–20)
CO2: 23 mmol/L (ref 22–32)
Calcium: 9.6 mg/dL (ref 8.9–10.3)
Chloride: 104 mmol/L (ref 101–111)
Creatinine, Ser: 0.72 mg/dL (ref 0.44–1.00)
GFR calc Af Amer: >60 mL/min (ref >60)
GLUCOSE: 90 mg/dL (ref 65–99)
POTASSIUM: 3.7 mmol/L (ref 3.5–5.1)
Sodium: 138 mmol/L (ref 135–145)

## 2017-11-15 LAB — TROPONIN I: Troponin I: <0.03 ng/mL (ref <0.03)

## 2017-11-15 NOTE — ED Provider Notes (Signed)
[facility] EMERGENCY DEPARTMENT Provider Note   CSN: [id number] Arrival date & time: 11/15/17  1211     History   Chief Complaint Chief Complaint  Patient presents with  . Loss of Consciousness    HPI Deborah Meyer is a 52 y.o. female.  Patient with history of stroke and right-sided deficits,neuromuscular disorder, heart attack, seizures, COPD, significant weight loss for which she has been following with endocrine and gastroenterology specialist presents after possible stroke/syncopal event this morning. Event occurred at approximate 5:30 in the morning and the patient was not responding to significant other verbal stimulation. Patient gradually improved and had mild speech difficulty. Currently patient is back to baseline and denies any neurologic abnormalities at this time. No concerning headaches. Patient is on anticoagulants and has no aspirin allergy.     Past Medical History:  Diagnosis Date  . Asthma   . COPD (chronic obstructive pulmonary disease) ([place])   . Dyspnea on exertion   . Epilepsy with partial complex seizures ([place])   . Heart attack ([place])   . Heart murmur   . Hypertension   . Low iron    hx of iron supplementation  . Migraine   . Neuromuscular disorder ([place])    frequent falls  . Neuromuscular disorder ([place])   . Normal cardiac stress test 02/2014   low risk stress echo  . Osteoma    craniotomy 2006  . Progressive multifocal leukoencephalopathy   . Sleep apnea   . Stroke ([place])   . Thiamin deficiency   . Vitamin D deficiency     Patient Active Problem List   Diagnosis Date Noted  . Epilepsy ([place]) 04/10/2017  . Memory loss 12/07/2016  . Late effects of CVA (cerebrovascular accident) 10/14/2016  . HLD (hyperlipidemia) goal less than LDL 70 10/14/2016  . Chest pain 08/28/2016  . Prolonged Q-T interval on ECG 05/25/2014  . Syncope 05/25/2014  . Right facial numbness 05/25/2014  . Hypokalemia 05/25/2014  . Fall   . Hypertension   . COPD  (chronic obstructive pulmonary disease) ([place])   . Neuromuscular disorder ([place])   . Heart murmur     Past Surgical History:  Procedure Laterality Date  . breast tumors removed    . CRANIOTOMY    . LOOP RECORDER IMPLANT  2017, july  . MUSCLE BIOPSY    . OSTEOTOMY    . TUBAL LIGATION       OB History   None      Home Medications    Prior to Admission medications   Medication Sig Start Date End Date Taking? Authorizing Provider  albuterol (PROVENTIL HFA;VENTOLIN HFA) 108 (90 BASE) MCG/ACT inhaler Inhale 2 puffs into the lungs every 6 (six) hours as needed for wheezing.   Yes [provider]  Armodafinil (NUVIGIL) 250 MG tablet Take 250 mg by mouth daily.   Yes [provider]  atenolol (TENORMIN) 25 MG tablet Take 1 tablet (25 mg total) by mouth daily. 02/16/17  Yes Timmothy Euler, MD  atorvastatin (LIPITOR) 40 MG tablet TAKE 1 TABLET BY MOUTH ONCE DAILY 08/09/17  Yes Timmothy Euler, MD  baclofen (LIORESAL) 20 MG tablet TAKE 1 TABLET BY MOUTH 2 TIMES A DAY AS NEEDED FOR MUSCLE SPASMS 10/06/17  Yes Timmothy Euler, MD  BRILINTA 90 MG TABS tablet Take 1 Tablet by mouth 2 times a day 03/05/17  Yes Timmothy Euler, MD  clonazePAM (KLONOPIN) 0.5 MG tablet Take 0.5 mg by mouth at bedtime.  Yes [provider]  Coenzyme Q10 50 MG CAPS Take 1 capsule by mouth daily.    Yes [provider]  cyanocobalamin (,VITAMIN B-12,) 1000 MCG/ML injection Inject 1,000 mcg into the muscle once a week. On Sunday.   Yes [provider]  diazepam (VALIUM) 5 MG tablet Take 1 tablet by mouth every 6 (six) hours as needed for anxiety.  09/30/16  Yes [provider]  donepezil (ARICEPT) 10 MG tablet Take 1 tablet by mouth daily.    Yes [provider]  fluticasone-salmeterol (ADVAIR HFA) 230-21 MCG/ACT inhaler Inhale 2 puffs into the lungs 2 (two) times daily. 11/26/16  Yes Hawks, Christy A, FNP  HYDROcodone-acetaminophen (NORCO/VICODIN)  5-325 MG per tablet Take 1 tablet by mouth every 6 (six) hours as needed for pain.   Yes [provider]  ibandronate (BONIVA) 150 MG tablet Take 1 tablet by mouth every 30 (thirty) days.   Yes [provider]  labetalol (NORMODYNE) 100 MG tablet Take 0.5 tablets by mouth 2 (two) times daily. 11/05/17  Yes [provider]  lamoTRIgine (LAMICTAL) 200 MG tablet Take 200 mg by mouth 2 (two) times daily. 11/05/17  Yes [provider]  memantine (NAMENDA) 10 MG tablet Take 1 tablet by mouth 2 (two) times daily. 11/04/17  Yes [provider]  MethylPREDNISolone Acetate (DEPO-MEDROL IJ) Inject 10 mg as directed every 3 (three) days.   Yes [provider]  montelukast (SINGULAIR) 10 MG tablet Take 10 mg by mouth at bedtime.   Yes [provider]  naproxen (NAPROSYN) 500 MG tablet Take 1 tablet by mouth 2 (two) times daily as needed for mild pain.    Yes [provider]  Omega-3 Fatty Acids (FISH OIL) 1000 MG CAPS Take 1,000 mg by mouth daily.    Yes [provider]  ondansetron (ZOFRAN) 8 MG tablet Take 8 mg by mouth every 8 (eight) hours as needed. for nausea 11/06/16  Yes [provider]  SPIRIVA RESPIMAT 1.25 MCG/ACT AERS Inhale 2 puffs into the lungs daily. 10/31/17  Yes [provider]  traZODone (DESYREL) 100 MG tablet Take 100 mg by mouth at bedtime. 10/08/17  Yes [provider]  TROKENDI XR 100 MG CP24 Take 1 capsule by mouth daily. 11/04/17  Yes [provider]  vitamin E 400 UNIT capsule Take 400 Units by mouth daily.   Yes [provider]  EPINEPHrine 0.3 mg/0.3 mL IJ SOAJ injection inject 0.3 ML into THE muscle ONCE as needed FOR anaphylaxis 09/30/16   [provider]  nitroGLYCERIN (NITROSTAT) 0.4 MG SL tablet Place 1 tablet (0.4 mg total) under the tongue every 5 (five) minutes. 03/04/17   Timmothy Euler, MD    Family History Family History  Problem Relation Age  of Onset  . Diabetes Mother   . Hypertension Mother   . Stroke Mother   . Heart disease Mother   . Heart disease Father   . Stroke Father   . Depression Father   . Alzheimer's disease Father   . Diabetes Sister   . Diabetes Brother   . Hyperlipidemia Brother     Social History Social History   Tobacco Use  . Smoking status: Never Smoker  . Smokeless tobacco: Never Used  Substance Use Topics  . Alcohol use: No  . Drug use: No     Allergies   Bee venom; Ciprofloxacin hcl; Contrast media [iodinated diagnostic agents]; Lorazepam; Methylprednisolone sodium succ; Morphine and related; Mushroom extract  complex; Topiramate er; Ciprofloxacin; Lac bovis; Protonix  [pantoprazole sodium]; Dairy aid [lactase]; Lyrica [pregabalin]; Toradol [ketorolac tromethamine]; Xanax xr [alprazolam er]; Asa [aspirin]; Penicillins; Prednisone; and Tramadol   Review of Systems Review of Systems  Constitutional: Negative for chills and fever.  HENT: Negative for congestion.   Eyes: Positive for visual disturbance.  Respiratory: Negative for shortness of breath.   Cardiovascular: Negative for chest pain.  Gastrointestinal: Negative for abdominal pain and vomiting.  Genitourinary: Negative for dysuria and flank pain.  Musculoskeletal: Negative for back pain, neck pain and neck stiffness.  Skin: Negative for rash.  Neurological: Positive for speech difficulty and light-headedness. Negative for headaches.     Physical Exam Updated Vital Signs BP (!) 141/77 (BP Location: Left Arm)   Pulse (!) 56   Temp 97.9 F (36.6 C) (Oral)   Resp 16   Ht 5\' 8"  (1.727 m)   Wt 47.6 kg (105 lb)   LMP 08/24/2016   SpO2 100%   BMI 15.97 kg/m   Physical Exam  Constitutional: She is oriented to person, place, and time. She appears well-developed and well-nourished.  HENT:  Head: Normocephalic and atraumatic.  Eyes: Conjunctivae are normal. Right eye exhibits no discharge. Left eye exhibits no discharge.    Neck: Normal range of motion. Neck supple. No tracheal deviation present.  Cardiovascular: Normal rate and regular rhythm.  Pulmonary/Chest: Effort normal and breath sounds normal.  Abdominal: Soft. She exhibits no distension. There is no tenderness. There is no guarding.  Musculoskeletal: She exhibits no edema.  Neurological: She is alert and oriented to person, place, and time.  Patient has 5+ strength upper and lower extremities equal bilateral flexion/extension except for right lower extremity which she has braces on from her chronic muscular dystrophy. Cranial nerves intact. Coordination finger-nose intact  Skin: Skin is warm. No rash noted.  Psychiatric: She has a normal mood and affect.  Nursing note and vitals reviewed.    ED Treatments / Results  Labs (all labs ordered are listed, but only abnormal results are displayed) Labs Reviewed  CBC - Abnormal; Notable for the following components:      Result Value   WBC 3.6 (*)    RBC 3.66 (*)    Hemoglobin 10.9 (*)    HCT 34.6 (*)    All other components within normal limits  URINALYSIS, ROUTINE W REFLEX MICROSCOPIC - Abnormal; Notable for the following components:   APPearance HAZY (*)    Hgb urine dipstick SMALL (*)    Leukocytes, UA LARGE (*)    Bacteria, UA RARE (*)    Non Squamous Epithelial 0-5 (*)    All other components within normal limits  URINE CULTURE  BASIC METABOLIC PANEL  TROPONIN I  CBG MONITORING, ED    EKG EKG Interpretation  Date/Time:  Monday November 15 2017 13:12:38 EDT Ventricular Rate:  56 PR Interval:    QRS Duration: 102 QT Interval:  472 QTC Calculation: 456 R Axis:   -60 Text Interpretation:  Sinus rhythm LAD, consider left anterior fascicular block Baseline wander in lead(s) II III aVF Confirmed by Elnora Morrison [phone]) on 11/15/2017 2:34:26 PM   Radiology Mr Brain Wo Contrast  Result Date: 11/15/2017 CLINICAL DATA:  Syncopal episode after getting up to turn off the alarm. Patient  lost vision and then woke up on the floor. EXAM: MRI HEAD WITHOUT CONTRAST TECHNIQUE: Multiplanar, multiecho pulse sequences of the brain and surrounding structures were obtained without intravenous contrast. COMPARISON:  MRI brain 05/25/2014 FINDINGS:  Brain: Diffusion-weighted images demonstrate no acute or subacute infarction. Periventricular and subcortical T2 hyperintensities bilaterally are advanced for age, stable from prior exam. Ventricles are proportionate to the degree of atrophy. No significant extra-axial fluid collection is present. The internal auditory canals are within normal limits bilaterally. The brainstem and cerebellum are normal. Vascular: Flow is present in the major intracranial arteries. Skull and upper cervical spine: The skull base is within normal limits. The craniocervical junction is normal. Midline sagittal structures are unremarkable. The upper cervical spine is within limits. Sinuses/Orbits: The paranasal sinuses and mastoid air cells are clear. Globes and orbits are within normal limits. IMPRESSION: 1. No acute intracranial abnormality to explain the patient's recent episode. 2. Atrophy and white matter disease is advanced for age, stable. This likely reflects the sequela of chronic microvascular ischemia. A demyelinating process is considered less likely, but could contribute. Electronically Signed   By: San Morelle M.D.   On: 11/15/2017 13:47    Procedures Procedures (including critical care time)  Medications Ordered in ED Medications - No data to display   Initial Impression / Assessment and Plan / ED Course  I have reviewed the triage vital signs and the nursing notes.  Pertinent labs & imaging results that were available during my care of the patient were reviewed by me and considered in my medical decision making (see chart for details).    Patient presents after abnormal event this morning with possibly related to stroke/seizure versus cardiac. Plan  for blood work, cardiac evaluation, MRI to look for stroke as patient feels this may be similar.   On reassessment patient at baseline with no symptoms. Discussed she may have had a seizure earlier this morning versus syncope. MRI results showed no stroke. Patient comfortable with close follow-up with primary doctor and her specialist. No indication for admission at this time. Reviewed recent echo which had mild LVH no acute findings.  Results and differential diagnosis were discussed with the patient/parent/guardian. Xrays were independently reviewed by myself.  Close follow up outpatient was discussed, comfortable with the plan.   Medications - No data to display  Vitals:   11/15/17 1222 11/15/17 1312  BP: 117/61 (!) 141/77  Pulse: (!) 59 (!) 56  Resp: 16 16  Temp: 97.9 F (36.6 C)   TempSrc: Oral   SpO2: 99% 100%  Weight: 47.6 kg (105 lb)   Height: 5\' 8"  (1.727 m)     Final diagnoses:  Difficulty with speech  Unresponsive episode  Acute cystitis with hematuria    Final Clinical Impressions(s) / ED Diagnoses   Final diagnoses:  Difficulty with speech  Unresponsive episode  Acute cystitis with hematuria    ED Discharge Orders    None       Elnora Morrison, MD 11/15/17 1501

## 2017-11-15 NOTE — Discharge Instructions (Addendum)
Follow-up culture result and reassessment with primary doctor in 2 days. Return to the emergency room for persistent seizures, strokelike symptoms, fevers or new concerns.

## 2017-11-15 NOTE — ED Notes (Signed)
Patient in MRI 

## 2017-11-15 NOTE — ED Triage Notes (Signed)
Pt reports she was sent by her pcp for a syncopal episode last night after she got up turn the alarm off.  States she got up and couldn't see due to vision changes and then woke up on the floor.  Denies any problems currently.

## 2017-11-17 LAB — URINE CULTURE: Culture: NO GROWTH

## 2017-11-18 ENCOUNTER — Ambulatory Visit (INDEPENDENT_AMBULATORY_CARE_PROVIDER_SITE_OTHER): Payer: Managed Care, Other (non HMO)

## 2017-11-18 ENCOUNTER — Ambulatory Visit (INDEPENDENT_AMBULATORY_CARE_PROVIDER_SITE_OTHER): Payer: Managed Care, Other (non HMO) | Admitting: Family Medicine

## 2017-11-18 ENCOUNTER — Encounter: Payer: Self-pay | Admitting: Family Medicine

## 2017-11-18 VITALS — BP 93/55 | HR 68 | Temp 97.0°F | Ht 67.0 in | Wt 105.0 lb

## 2017-11-18 DIAGNOSIS — R0781 Pleurodynia: Secondary | ICD-10-CM | POA: Diagnosis not present

## 2017-11-18 DIAGNOSIS — M25511 Pain in right shoulder: Secondary | ICD-10-CM

## 2017-11-18 DIAGNOSIS — M25551 Pain in right hip: Secondary | ICD-10-CM

## 2017-11-18 DIAGNOSIS — W19XXXA Unspecified fall, initial encounter: Secondary | ICD-10-CM

## 2017-11-18 NOTE — Patient Instructions (Signed)
Contusion A contusion is a deep bruise. Contusions are the result of a blunt injury to tissues and muscle fibers under the skin. The injury causes bleeding under the skin. The skin overlying the contusion may turn blue, purple, or yellow. Minor injuries will give you a painless contusion, but more severe contusions may stay painful and swollen for a few weeks. What are the causes? This condition is usually caused by a blow, trauma, or direct force to an area of the body. What are the signs or symptoms? Symptoms of this condition include:  Swelling of the injured area.  Pain and tenderness in the injured area.  Discoloration. The area may have redness and then turn blue, purple, or yellow.  How is this diagnosed? This condition is diagnosed based on a physical exam and medical history. An X-ray, CT scan, or MRI may be needed to determine if there are any associated injuries, such as broken bones (fractures). How is this treated? Specific treatment for this condition depends on what area of the body was injured. In general, the best treatment for a contusion is resting, icing, applying pressure to (compression), and elevating the injured area. This is often called the RICE strategy. Over-the-counter anti-inflammatory medicines may also be recommended for pain control. Follow these instructions at home:  Rest the injured area.  If directed, apply ice to the injured area: ? Put ice in a plastic bag. ? Place a towel between your skin and the bag. ? Leave the ice on for 20 minutes, 2-3 times per day.  If directed, apply light compression to the injured area using an elastic bandage. Make sure the bandage is not wrapped too tightly. Remove and reapply the bandage as directed by your health care provider.  If possible, raise (elevate) the injured area above the level of your heart while you are sitting or lying down.  Take over-the-counter and prescription medicines only as told by your health  care provider. Contact a health care provider if:  Your symptoms do not improve after several days of treatment.  Your symptoms get worse.  You have difficulty moving the injured area. Get help right away if:  You have severe pain.  You have numbness in a hand or foot.  Your hand or foot turns pale or cold. This information is not intended to replace advice given to you by your health care provider. Make sure you discuss any questions you have with your health care provider. Document Released: 04/15/2005 Document Revised: 11/14/2015 Document Reviewed: 11/21/2014 [employer] Interactive Patient Education  2018 [employer]  

## 2017-11-18 NOTE — Progress Notes (Signed)
Subjective: CC: ED follow up PCP: Janora Norlander, DO QQP:[id number] A Deborah Meyer is a 52 y.o. female presenting to clinic today for:  1. ED follow up Patient was seen in the emergency department 4/29 after having fallen.  It was uncertain at that time as to whether or not this was a result of stroke versus syncopal event versus a from seizure-like activity.  During her evaluation with the emergency department provider, she was back to her baseline.  She had an MRI of the brain which did not reveal any acute strokes.  She had just seen her cardiologist a couple weeks prior and had an echocardiogram with no acute findings except for mild LVH.  During that visit she was complaining of right-sided pain in the shoulder hip and ribs.  No imaging was obtained during that visit.  She had a urinalysis that had questionable findings of urinary tract infection.  Urine culture was negative for growth.  She denies urinary symptoms.  Today, she notes persistent right shoulder, right hip and right rib pain.  She takes a muscle relaxer at baseline for her known neuromuscular disorder.  She also has hydrocodone at home which she can take if needed.  She is unable to take oral NSAIDs secondary to current anticoagulation.  Past medical history is also significant for stroke for which she still has residual deficits.  She notes a large bruise along her right side as a result of the fall.  She notes that she has an indwelling [employer] device but unfortunately has lost the sensor for it.  She is unsure as whether or not she sustained any arrhythmias during time of fall.  ROS: Per HPI  Allergies  Allergen Reactions  . Bee Venom Anaphylaxis  . Ciprofloxacin Hcl Hives, Itching and Swelling  . Contrast Media [Iodinated Diagnostic Agents] Shortness Of Breath    Pt reports nausea, SOB, "passing out" during IV contrast administration in the past. Pt reports nausea, SOB, "passing out" during IV contrast administration in  the past.  . Lorazepam Other (See Comments) and Shortness Of Breath  . Methylprednisolone Sodium Succ Shortness Of Breath  . Morphine And Related Shortness Of Breath and Other (See Comments)    "Blood pressure bottoms out"   . Mushroom Extract Complex Other (See Comments)    "Stopped breathing"  . Topiramate Er Hypertension and Shortness Of Breath  . Ciprofloxacin Hives  . Lac Bovis Rash  . Protonix  [Pantoprazole Sodium] Nausea And Vomiting  . Dairy Aid [Lactase] Nausea And Vomiting  . Lyrica [Pregabalin] Nausea And Vomiting    Caused extreme high blood pressure and sycotic episodes.    . Toradol [Ketorolac Tromethamine] Nausea And Vomiting and Other (See Comments)    "passed out"  . Xanax Xr [Alprazolam Er] Hypertension    hallucinations  . Asa [Aspirin] Nausea And Vomiting and Rash  . Penicillins Rash  . Prednisone Rash  . Tramadol Rash   Past Medical History:  Diagnosis Date  . Asthma   . COPD (chronic obstructive pulmonary disease) ([place])   . Dyspnea on exertion   . Epilepsy with partial complex seizures ([place])   . Heart attack ([place])   . Heart murmur   . Hypertension   . Low iron    hx of iron supplementation  . Migraine   . Neuromuscular disorder ([place])    frequent falls  . Neuromuscular disorder ([place])   . Normal cardiac stress test 02/2014   low risk stress echo  . Osteoma  craniotomy 2006  . Progressive multifocal leukoencephalopathy   . Sleep apnea   . Stroke ([place])   . Thiamin deficiency   . Vitamin D deficiency     Current Outpatient Medications:  .  albuterol (PROVENTIL HFA;VENTOLIN HFA) 108 (90 BASE) MCG/ACT inhaler, Inhale 2 puffs into the lungs every 6 (six) hours as needed for wheezing., Disp: , Rfl:  .  Armodafinil (NUVIGIL) 250 MG tablet, Take 250 mg by mouth daily., Disp: , Rfl:  .  atenolol (TENORMIN) 25 MG tablet, Take 1 tablet (25 mg total) by mouth daily., Disp: 90 tablet, Rfl: 3 .  atorvastatin (LIPITOR) 40 MG tablet, TAKE 1 TABLET BY MOUTH  ONCE DAILY, Disp: 90 tablet, Rfl: 1 .  baclofen (LIORESAL) 20 MG tablet, TAKE 1 TABLET BY MOUTH 2 TIMES A DAY AS NEEDED FOR MUSCLE SPASMS, Disp: 60 tablet, Rfl: 11 .  BRILINTA 90 MG TABS tablet, Take 1 Tablet by mouth 2 times a day, Disp: 60 tablet, Rfl: 11 .  clonazePAM (KLONOPIN) 0.5 MG tablet, Take 0.5 mg by mouth at bedtime., Disp: , Rfl:  .  Coenzyme Q10 50 MG CAPS, Take 1 capsule by mouth daily. , Disp: , Rfl:  .  cyanocobalamin (,VITAMIN B-12,) 1000 MCG/ML injection, Inject 1,000 mcg into the muscle once a week. On Sunday., Disp: , Rfl:  .  diazepam (VALIUM) 5 MG tablet, Take 1 tablet by mouth every 6 (six) hours as needed for anxiety. , Disp: , Rfl: 0 .  donepezil (ARICEPT) 10 MG tablet, Take 1 tablet by mouth daily. , Disp: , Rfl:  .  EPINEPHrine 0.3 mg/0.3 mL IJ SOAJ injection, inject 0.3 ML into THE muscle ONCE as needed FOR anaphylaxis, Disp: , Rfl: 0 .  fluticasone-salmeterol (ADVAIR HFA) 230-21 MCG/ACT inhaler, Inhale 2 puffs into the lungs 2 (two) times daily., Disp: 1 Inhaler, Rfl: 12 .  HYDROcodone-acetaminophen (NORCO/VICODIN) 5-325 MG per tablet, Take 1 tablet by mouth every 6 (six) hours as needed for pain., Disp: , Rfl:  .  ibandronate (BONIVA) 150 MG tablet, Take 1 tablet by mouth every 30 (thirty) days., Disp: , Rfl:  .  labetalol (NORMODYNE) 100 MG tablet, Take 0.5 tablets by mouth 2 (two) times daily., Disp: , Rfl: 11 .  lamoTRIgine (LAMICTAL) 200 MG tablet, Take 200 mg by mouth 2 (two) times daily., Disp: , Rfl: 6 .  memantine (NAMENDA) 10 MG tablet, Take 1 tablet by mouth 2 (two) times daily., Disp: , Rfl: 6 .  MethylPREDNISolone Acetate (DEPO-MEDROL IJ), Inject 10 mg as directed every 3 (three) days., Disp: , Rfl:  .  montelukast (SINGULAIR) 10 MG tablet, Take 10 mg by mouth at bedtime., Disp: , Rfl:  .  naproxen (NAPROSYN) 500 MG tablet, Take 1 tablet by mouth 2 (two) times daily as needed for mild pain. , Disp: , Rfl:  .  nitroGLYCERIN (NITROSTAT) 0.4 MG SL tablet,  Place 1 tablet (0.4 mg total) under the tongue every 5 (five) minutes., Disp: 30 tablet, Rfl: 1 .  Omega-3 Fatty Acids (FISH OIL) 1000 MG CAPS, Take 1,000 mg by mouth daily. , Disp: , Rfl:  .  ondansetron (ZOFRAN) 8 MG tablet, Take 8 mg by mouth every 8 (eight) hours as needed. for nausea, Disp: , Rfl: 0 .  SPIRIVA RESPIMAT 1.25 MCG/ACT AERS, Inhale 2 puffs into the lungs daily., Disp: , Rfl: 11 .  traZODone (DESYREL) 100 MG tablet, Take 100 mg by mouth at bedtime., Disp: , Rfl: 6 .  TROKENDI XR 100  MG CP24, Take 1 capsule by mouth daily., Disp: , Rfl: 6 .  vitamin E 400 UNIT capsule, Take 400 Units by mouth daily., Disp: , Rfl:  Social History   Socioeconomic History  . Marital status: Married    Spouse name: Not on file  . Number of children: Not on file  . Years of education: Not on file  . Highest education level: Not on file  Occupational History  . Not on file  Social Needs  . Financial resource strain: Not on file  . Food insecurity:    Worry: Not on file    Inability: Not on file  . Transportation needs:    Medical: Not on file    Non-medical: Not on file  Tobacco Use  . Smoking status: Never Smoker  . Smokeless tobacco: Never Used  Substance and Sexual Activity  . Alcohol use: No  . Drug use: No  . Sexual activity: Yes  Lifestyle  . Physical activity:    Days per week: Not on file    Minutes per session: Not on file  . Stress: Not on file  Relationships  . Social connections:    Talks on phone: Not on file    Gets together: Not on file    Attends religious service: Not on file    Active member of club or organization: Not on file    Attends meetings of clubs or organizations: Not on file    Relationship status: Not on file  . Intimate partner violence:    Fear of current or ex partner: Not on file    Emotionally abused: Not on file    Physically abused: Not on file    Forced sexual activity: Not on file  Other Topics Concern  . Not on file  Social History  Narrative  . Not on file   Family History  Problem Relation Age of Onset  . Diabetes Mother   . Hypertension Mother   . Stroke Mother   . Heart disease Mother   . Heart disease Father   . Stroke Father   . Depression Father   . Alzheimer's disease Father   . Diabetes Sister   . Diabetes Brother   . Hyperlipidemia Brother     Objective: Office vital signs reviewed. BP (!) 93/55   Pulse 68   Temp (!) 97 F (36.1 C) (Oral)   Ht 5\' 7"  (1.702 m)   Wt 105 lb (47.6 kg)   LMP 08/24/2016   BMI 16.45 kg/m   Physical Examination:  General: Awake, alert, No acute distress Pulm: normal work of breathing on room air Extremities: warm, well perfused, No edema, cyanosis or clubbing; +2 pulses bilaterally MSK: uses wheelchair for ambulation.  She is able to move extremities. Skin: dry; intact; +hematoma noted along the right hip. Area Is tender to palpation. No gross shoulder/ hip or rib deformities appreciated.  Dg Ribs Unilateral Right  Result Date: 11/18/2017 CLINICAL DATA:  Pain following fall EXAM: RIGHT RIBS - 2 VIEW COMPARISON:  Chest radiograph July 06, 2017 FINDINGS: Oblique and cone-down rib images were obtained. There is no appreciable acute fracture. There is evidence of an old healed anterior right sixth rib fracture with remodeling. Right lung is clear. Heart size normal. No right pleural effusion or right-sided pneumothorax. IMPRESSION: Old healed fracture anterior right sixth rib. No acute fracture. Right lung clear. No right-sided pneumothorax. Electronically Signed   By: Lowella Grip III M.D.   On: 11/18/2017 10:50  Dg Shoulder Right  Result Date: 11/18/2017 CLINICAL DATA:  Recent fall EXAM: RIGHT SHOULDER - 2+ VIEW COMPARISON:  None. FINDINGS: Internal rotation, external rotation, and Y scapular images were obtained. There is no evident fracture or dislocation. There is bony overgrowth along the superior chromium clavicular joint with small calcifications in this  area, felt to be of arthropathic etiology. There is mild narrowing of the glenohumeral joint. No erosive change. Visualized right lung is clear. IMPRESSION: Areas of osteoarthritic change.  No fracture or dislocation. Electronically Signed   By: Lowella Grip III M.D.   On: 11/18/2017 10:46   Dg Hip Unilat W Or W/o Pelvis 2-3 Views Right  Result Date: 11/18/2017 CLINICAL DATA:  Pain following fall EXAM: DG HIP (WITH OR WITHOUT PELVIS) 2-3V RIGHT COMPARISON:  None. FINDINGS: Frontal pelvis as well as frontal and lateral right hip images were obtained. There is no fracture or dislocation. There is no appreciable joint space narrowing or erosion. There is spurring along the pubic symphysis superiorly. IMPRESSION: Mild spurring in the pubic symphysis region. No appreciable joint space narrowing or erosion. No fracture or dislocation. Electronically Signed   By: Lowella Grip III M.D.   On: 11/18/2017 10:48    Assessment/ Plan: 52 y.o. female   1. Fall, initial encounter Given persistent pain and history of easy fracturing (she fractured her rib after sneezing in the past), x-rays were obtained of the right shoulder, right ribs and right hip.  Personal review did not demonstrate any acute abnormalities, including fracture or dislocation.  Radiologist review also did not appreciate any acute problems.  Supportive care is recommended at this time.  I recommended icing the affected areas to reduce inflammation.  Stay as active as possible to reduce muscle spasm.  Continue baclofen and Norco if needed for pain or muscle spasm.  Copies of x-ray and recent lab results were reviewed and provided to the patient.  We also reviewed that she had a negative urine culture.  Since she is asymptomatic, no antibiotics are needed.  I encouraged her to call her cardiologist versus attempt to get the readings from the days that she sustained falls.  I do question a possible arrhythmia given her history of prolonged QT in  the past.  She will follow-up with me as needed. - DG Shoulder Right; Future - DG HIP UNILAT W OR W/O PELVIS 2-3 VIEWS RIGHT; Future - DG Ribs Unilateral Right; Future  2. Acute pain of right shoulder - DG Shoulder Right; Future  3. Rib pain on right side - DG Ribs Unilateral Right; Future  4. Right hip pain - DG HIP UNILAT W OR W/O PELVIS 2-3 VIEWS RIGHT; Future   Orders Placed This Encounter  Procedures  . DG Shoulder Right    Standing Status:   Future    Standing Expiration Date:   01/19/2019    Order Specific Question:   Reason for Exam (SYMPTOM  OR DIAGNOSIS REQUIRED)    Answer:   fall on right side    Order Specific Question:   Is patient pregnant?    Answer:   No    Order Specific Question:   Preferred imaging location?    Answer:   Internal    Order Specific Question:   Radiology Contrast Protocol - do NOT remove file path    Answer:   [email or web address]  . DG HIP UNILAT W OR W/O PELVIS 2-3 VIEWS RIGHT    Standing Status:   Future    Standing  Expiration Date:   01/17/2019    Order Specific Question:   Reason for Exam (SYMPTOM  OR DIAGNOSIS REQUIRED)    Answer:   fall on right side. now with pain    Order Specific Question:   Is the patient pregnant?    Answer:   No    Order Specific Question:   Preferred imaging location?    Answer:   Internal  . DG Ribs Unilateral Right    Standing Status:   Future    Standing Expiration Date:   01/19/2019    Order Specific Question:   Reason for Exam (SYMPTOM  OR DIAGNOSIS REQUIRED)    Answer:   fall on right side.  having pain    Order Specific Question:   Is patient pregnant?    Answer:   No    Order Specific Question:   Preferred imaging location?    Answer:   Internal    Order Specific Question:   Radiology Contrast Protocol - do NOT remove file path    Answer:   [email or web address]      Janora Norlander, [place] [phone]

## 2017-12-02 ENCOUNTER — Other Ambulatory Visit: Payer: Self-pay | Admitting: Family

## 2018-02-05 ENCOUNTER — Other Ambulatory Visit: Payer: Self-pay | Admitting: Family Medicine

## 2018-02-05 NOTE — Telephone Encounter (Signed)
Last lipid 07/06/17

## 2018-02-14 ENCOUNTER — Ambulatory Visit (INDEPENDENT_AMBULATORY_CARE_PROVIDER_SITE_OTHER): Payer: Managed Care, Other (non HMO) | Admitting: Family Medicine

## 2018-02-14 ENCOUNTER — Encounter: Payer: Self-pay | Admitting: Family Medicine

## 2018-02-14 ENCOUNTER — Other Ambulatory Visit: Payer: Self-pay | Admitting: Family Medicine

## 2018-02-14 VITALS — BP 125/74 | HR 64 | Temp 98.1°F

## 2018-02-14 DIAGNOSIS — G709 Myoneural disorder, unspecified: Secondary | ICD-10-CM

## 2018-02-14 DIAGNOSIS — R7989 Other specified abnormal findings of blood chemistry: Secondary | ICD-10-CM

## 2018-02-14 DIAGNOSIS — Z5181 Encounter for therapeutic drug level monitoring: Secondary | ICD-10-CM | POA: Diagnosis not present

## 2018-02-14 DIAGNOSIS — E162 Hypoglycemia, unspecified: Secondary | ICD-10-CM | POA: Diagnosis not present

## 2018-02-14 LAB — CMP14+EGFR
ALBUMIN: 4.1 g/dL (ref 3.5–5.5)
ALT: 35 IU/L — ABNORMAL HIGH (ref 0–32)
AST: 35 IU/L (ref 0–40)
Albumin/Globulin Ratio: 2.6 — ABNORMAL HIGH (ref 1.2–2.2)
Alkaline Phosphatase: 141 IU/L — ABNORMAL HIGH (ref 39–117)
BILIRUBIN TOTAL: 0.2 mg/dL (ref 0.0–1.2)
BUN/Creatinine Ratio: 18 (ref 9–23)
BUN: 12 mg/dL (ref 6–24)
CHLORIDE: 104 mmol/L (ref 96–106)
CO2: 22 mmol/L (ref 20–29)
CREATININE: 0.67 mg/dL (ref 0.57–1.00)
Calcium: 9.4 mg/dL (ref 8.7–10.2)
GFR, EST AFRICAN AMERICAN: 117 mL/min/{1.73_m2} (ref >59)
GFR, EST NON AFRICAN AMERICAN: 101 mL/min/{1.73_m2} (ref >59)
Globulin, Total: 1.6 g/dL (ref 1.5–4.5)
Glucose: 80 mg/dL (ref 65–99)
POTASSIUM: 4.2 mmol/L (ref 3.5–5.2)
Sodium: 140 mmol/L (ref 134–144)
TOTAL PROTEIN: 5.7 g/dL — AB (ref 6.0–8.5)

## 2018-02-14 LAB — CBC WITH DIFFERENTIAL/PLATELET
BASOS ABS: 0 10*3/uL (ref 0.0–0.2)
BASOS: 1 %
EOS (ABSOLUTE): 0.1 10*3/uL (ref 0.0–0.4)
Eos: 2 %
HEMOGLOBIN: 10.7 g/dL — AB (ref 11.1–15.9)
Hematocrit: 32.4 % — ABNORMAL LOW (ref 34.0–46.6)
IMMATURE GRANS (ABS): 0 10*3/uL (ref 0.0–0.1)
Immature Granulocytes: 0 %
LYMPHS: 24 %
Lymphocytes Absolute: 1.2 10*3/uL (ref 0.7–3.1)
MCH: 29.6 pg (ref 26.6–33.0)
MCHC: 33 g/dL (ref 31.5–35.7)
MCV: 90 fL (ref 79–97)
MONOCYTES: 11 %
Monocytes Absolute: 0.6 10*3/uL (ref 0.1–0.9)
NEUTROS ABS: 3.2 10*3/uL (ref 1.4–7.0)
Neutrophils: 62 %
Platelets: 247 10*3/uL (ref 150–450)
RBC: 3.62 x10E6/uL — ABNORMAL LOW (ref 3.77–5.28)
RDW: 14.4 % (ref 12.3–15.4)
WBC: 5 10*3/uL (ref 3.4–10.8)

## 2018-02-14 NOTE — Patient Instructions (Addendum)
Schedule follow-up visit with your endocrinologist.  Make sure that you are taking the medication he prescribed as directed.  Call our office with what he has prescribed.  It is not visible in the electronic record.  You had labs performed today.  You will be contacted with the results of the labs once they are available, usually in the next 3 business days for routine lab work.  If you had a pap smear or biopsy performed, expect to be contacted in about 7-10 days.   Hypoglycemia Hypoglycemia occurs when the level of sugar (glucose) in the blood is too low. Glucose is a type of sugar that provides the body's main source of energy. Certain hormones (insulin and glucagon) control the level of glucose in the blood. Insulin lowers blood glucose, and glucagon increases blood glucose. Hypoglycemia can result from having too much insulin in the bloodstream, or from not eating enough food that contains glucose. Hypoglycemia can happen in people who do or do not have diabetes. It can develop quickly, and it can be a medical emergency. What are the causes? Hypoglycemia occurs most often in people who have diabetes. If you have diabetes, hypoglycemia may be caused by:  Diabetes medicine.  Not eating enough, or not eating often enough.  Increased physical activity.  Drinking alcohol, especially when you have not eaten recently.  If you do not have diabetes, hypoglycemia may be caused by:  A tumor in the pancreas. The pancreas is the organ that makes insulin.  Not eating enough, or not eating for long periods at a time (fasting).  Severe infection or illness that affects the liver, heart, or kidneys.  Certain medicines.  You may also have reactive hypoglycemia. This condition causes hypoglycemia within 4 hours of eating a meal. This may occur after having stomach surgery. Sometimes, the cause of reactive hypoglycemia is not known. What increases the risk? Hypoglycemia is more likely to develop  in:  People who have diabetes and take medicines to lower blood glucose.  People who abuse alcohol.  People who have a severe illness.  What are the signs or symptoms? Hypoglycemia may not cause any symptoms. If you have symptoms, they may include:  Hunger.  Anxiety.  Sweating and feeling clammy.  Confusion.  Dizziness or feeling light-headed.  Sleepiness.  Nausea.  Increased heart rate.  Headache.  Blurry vision.  Seizure.  Nightmares.  Tingling or numbness around the mouth, lips, or tongue.  A change in speech.  Decreased ability to concentrate.  A change in coordination.  Restless sleep.  Tremors or shakes.  Fainting.  Irritability.  How is this diagnosed? Hypoglycemia is diagnosed with a blood test to measure your blood glucose level. This blood test is done while you are having symptoms. Your health care provider may also do a physical exam and review your medical history. If you do not have diabetes, other tests may be done to find the cause of your hypoglycemia. How is this treated? This condition can often be treated by immediately eating or drinking something that contains glucose, such as:  3-4 sugar tablets (glucose pills).  Glucose gel, 15-gram tube.  Fruit juice, 4 oz (120 mL).  Regular soda (not diet soda), 4 oz (120 mL).  Low-fat milk, 4 oz (120 mL).  Several pieces of hard candy.  Sugar or honey, 1 Tbsp.  Treating Hypoglycemia If You Have Diabetes  If you are alert and able to swallow safely, follow the 15:15 rule:  Take 15 grams of  a rapid-acting carbohydrate. Rapid-acting options include: ? 1 tube of glucose gel. ? 3 glucose pills. ? 6-8 pieces of hard candy. ? 4 oz (120 mL) of fruit juice. ? 4 oz (120 ml) of regular (not diet) soda.  Check your blood glucose 15 minutes after you take the carbohydrate.  If the repeat blood glucose level is still at or below 70 mg/dL (3.9 mmol/L), take 15 grams of a carbohydrate  again.  If your blood glucose level does not increase above 70 mg/dL (3.9 mmol/L) after 3 tries, seek emergency medical care.  After your blood glucose level returns to normal, eat a meal or a snack within 1 hour.  Treating Severe Hypoglycemia Severe hypoglycemia is when your blood glucose level is at or below 54 mg/dL (3 mmol/L). Severe hypoglycemia is an emergency. Do not wait to see if the symptoms will go away. Get medical help right away. Call your local emergency services (911 in the [place] Do not drive yourself to the hospital. If you have severe hypoglycemia and you cannot eat or drink, you may need an injection of glucagon. A family member or close friend should learn how to check your blood glucose and how to give you a glucagon injection. Ask your health care provider if you need to have an emergency glucagon injection kit available. Severe hypoglycemia may need to be treated in a hospital. The treatment may include getting glucose through an IV tube. You may also need treatment for the cause of your hypoglycemia. Follow these instructions at home: General instructions  Avoid any diets that cause you to not eat enough food. Talk with your health care provider before you start any new diet.  Take over-the-counter and prescription medicines only as told by your health care provider.  Limit alcohol intake to no more than 1 drink per day for nonpregnant women and 2 drinks per day for men. One drink equals 12 oz of beer, 5 oz of wine, or 1 oz of hard liquor.  Keep all follow-up visits as told by your health care provider. This is important. If You Have Diabetes:   Make sure you know the symptoms of hypoglycemia.  Always have a rapid-acting carbohydrate snack with you to treat low blood sugar.  Follow your diabetes management plan, as told by your health care provider. Make sure you: ? Take your medicines as directed. ? Follow your exercise plan. ? Follow your meal plan. Eat on  time, and do not skip meals. ? Check your blood glucose as often as directed. Make sure to check your blood glucose before and after exercise. If you exercise longer or in a different way than usual, check your blood glucose more often. ? Follow your sick day plan whenever you cannot eat or drink normally. Make this plan in advance with your health care provider.  Share your diabetes management plan with people in your workplace, school, and household.  Check your urine for ketones when you are ill and as told by your health care provider.  Carry a medical alert card or wear medical alert jewelry. If You Have Reactive Hypoglycemia or Low Blood Sugar From Other Causes:  Monitor your blood glucose as told by your health care provider.  Follow instructions from your health care provider about eating or drinking restrictions. Contact a health care provider if:  You have problems keeping your blood glucose in your target range.  You have frequent episodes of hypoglycemia. Get help right away if:  You continue  to have hypoglycemia symptoms after eating or drinking something containing glucose.  Your blood glucose is at or below 54 mg/dL (3 mmol/L).  You have a seizure.  You faint. These symptoms may represent a serious problem that is an emergency. Do not wait to see if the symptoms will go away. Get medical help right away. Call your local emergency services (911 in the [place] Do not drive yourself to the hospital. This information is not intended to replace advice given to you by your health care provider. Make sure you discuss any questions you have with your health care provider. Document Released: 07/06/2005 Document Revised: 12/18/2015 Document Reviewed: 08/09/2015 Elsevier Interactive Patient Education  [employer].

## 2018-02-14 NOTE — Progress Notes (Signed)
Subjective: CC: hypoglycemia PCP: Janora Norlander, DO VXB:[id number] A Deborah Meyer is a 52 y.o. female presenting to clinic today for:  1. Hypoglycemia Patient reports that she had several instances of low blood sugars in the morning.  She notes that at one time, she required medical attention the emergency department for this.  She was referred to a specialist at [facility] for further evaluation.  She reports that last hypoglycemic episode was Saturday.  She does try and eat before bedtime but states that consumption of foods is limited secondary to severe abdominal pain, which is being worked up by gastroenterology.  Denies any hematemesis, melena or hematochezia.   ROS: Per HPI  Allergies  Allergen Reactions  . Bee Venom Anaphylaxis  . Ciprofloxacin Hcl Hives, Itching and Swelling  . Contrast Media [Iodinated Diagnostic Agents] Shortness Of Breath    Pt reports nausea, SOB, "passing out" during IV contrast administration in the past. Pt reports nausea, SOB, "passing out" during IV contrast administration in the past.  . Lorazepam Other (See Comments) and Shortness Of Breath  . Methylprednisolone Sodium Succ Shortness Of Breath  . Morphine And Related Shortness Of Breath and Other (See Comments)    "Blood pressure bottoms out"   . Mushroom Extract Complex Other (See Comments)    "Stopped breathing"  . Topiramate Er Hypertension and Shortness Of Breath  . Ciprofloxacin Hives  . Lac Bovis Rash  . Protonix  [Pantoprazole Sodium] Nausea And Vomiting  . Dairy Aid [Lactase] Nausea And Vomiting  . Lyrica [Pregabalin] Nausea And Vomiting    Caused extreme high blood pressure and sycotic episodes.    . Toradol [Ketorolac Tromethamine] Nausea And Vomiting and Other (See Comments)    "passed out"  . Xanax Xr [Alprazolam Er] Hypertension    hallucinations  . Asa [Aspirin] Nausea And Vomiting and Rash  . Penicillins Rash  . Prednisone Rash  . Tramadol Rash   Past Medical History:    Diagnosis Date  . Asthma   . COPD (chronic obstructive pulmonary disease) ([place])   . Dyspnea on exertion   . Epilepsy with partial complex seizures ([place])   . Heart attack ([place])   . Heart murmur   . Hypertension   . Low iron    hx of iron supplementation  . Migraine   . Neuromuscular disorder ([place])    frequent falls  . Neuromuscular disorder ([place])   . Normal cardiac stress test 02/2014   low risk stress echo  . Osteoma    craniotomy 2006  . Progressive multifocal leukoencephalopathy   . Sleep apnea   . Stroke ([place])   . Thiamin deficiency   . Vitamin D deficiency     Current Outpatient Medications:  .  albuterol (PROVENTIL HFA;VENTOLIN HFA) 108 (90 BASE) MCG/ACT inhaler, Inhale 2 puffs into the lungs every 6 (six) hours as needed for wheezing., Disp: , Rfl:  .  Armodafinil (NUVIGIL) 250 MG tablet, Take 250 mg by mouth daily., Disp: , Rfl:  .  atenolol (TENORMIN) 25 MG tablet, Take 1 tablet (25 mg total) by mouth daily., Disp: 90 tablet, Rfl: 3 .  atorvastatin (LIPITOR) 40 MG tablet, TAKE 1 TABLET BY MOUTH ONCE DAILY, Disp: 90 tablet, Rfl: 0 .  baclofen (LIORESAL) 20 MG tablet, TAKE 1 TABLET BY MOUTH 2 TIMES A DAY AS NEEDED FOR MUSCLE SPASMS, Disp: 60 tablet, Rfl: 11 .  BRILINTA 90 MG TABS tablet, Take 1 Tablet by mouth 2 times a day, Disp: 60 tablet, Rfl: 11 .  clonazePAM (KLONOPIN) 0.5 MG tablet, Take 0.5 mg by mouth at bedtime., Disp: , Rfl:  .  Coenzyme Q10 50 MG CAPS, Take 1 capsule by mouth daily. , Disp: , Rfl:  .  cyanocobalamin (,VITAMIN B-12,) 1000 MCG/ML injection, Inject 1,000 mcg into the muscle once a week. On Sunday., Disp: , Rfl:  .  diazepam (VALIUM) 5 MG tablet, Take 1 tablet by mouth every 6 (six) hours as needed for anxiety. , Disp: , Rfl: 0 .  donepezil (ARICEPT) 10 MG tablet, Take 1 tablet by mouth daily. , Disp: , Rfl:  .  EPINEPHrine 0.3 mg/0.3 mL IJ SOAJ injection, inject 0.3 ML into THE muscle ONCE as needed FOR anaphylaxis, Disp: , Rfl: 0 .   fluticasone-salmeterol (ADVAIR HFA) 230-21 MCG/ACT inhaler, Inhale 2 puffs into the lungs 2 (two) times daily., Disp: 1 Inhaler, Rfl: 12 .  HYDROcodone-acetaminophen (NORCO/VICODIN) 5-325 MG per tablet, Take 1 tablet by mouth every 6 (six) hours as needed for pain., Disp: , Rfl:  .  ibandronate (BONIVA) 150 MG tablet, Take 1 tablet by mouth every 30 (thirty) days., Disp: , Rfl:  .  labetalol (NORMODYNE) 100 MG tablet, TAKE ONE-HALF TABLET BY MOUTH EVERY 12 HOURS, Disp: 30 tablet, Rfl: 2 .  lamoTRIgine (LAMICTAL) 200 MG tablet, Take 200 mg by mouth 2 (two) times daily., Disp: , Rfl: 6 .  MethylPREDNISolone Acetate (DEPO-MEDROL IJ), Inject 10 mg as directed every 3 (three) days., Disp: , Rfl:  .  montelukast (SINGULAIR) 10 MG tablet, Take 10 mg by mouth at bedtime., Disp: , Rfl:  .  naproxen (NAPROSYN) 500 MG tablet, Take 1 tablet by mouth 2 (two) times daily as needed for mild pain. , Disp: , Rfl:  .  nitroGLYCERIN (NITROSTAT) 0.4 MG SL tablet, Place 1 tablet (0.4 mg total) under the tongue every 5 (five) minutes., Disp: 30 tablet, Rfl: 1 .  Omega-3 Fatty Acids (FISH OIL) 1000 MG CAPS, Take 1,000 mg by mouth daily. , Disp: , Rfl:  .  ondansetron (ZOFRAN) 8 MG tablet, Take 8 mg by mouth every 8 (eight) hours as needed. for nausea, Disp: , Rfl: 0 .  SPIRIVA RESPIMAT 1.25 MCG/ACT AERS, Inhale 2 puffs into the lungs daily., Disp: , Rfl: 11 .  traZODone (DESYREL) 100 MG tablet, Take 100 mg by mouth at bedtime., Disp: , Rfl: 6 .  TROKENDI XR 100 MG CP24, Take 1 capsule by mouth daily., Disp: , Rfl: 6 .  vitamin E 400 UNIT capsule, Take 400 Units by mouth daily., Disp: , Rfl:  Social History   Socioeconomic History  . Marital status: Married    Spouse name: Not on file  . Number of children: Not on file  . Years of education: Not on file  . Highest education level: Not on file  Occupational History  . Not on file  Social Needs  . Financial resource strain: Not on file  . Food insecurity:     Worry: Not on file    Inability: Not on file  . Transportation needs:    Medical: Not on file    Non-medical: Not on file  Tobacco Use  . Smoking status: Never Smoker  . Smokeless tobacco: Never Used  Substance and Sexual Activity  . Alcohol use: No  . Drug use: No  . Sexual activity: Yes  Lifestyle  . Physical activity:    Days per week: Not on file    Minutes per session: Not on file  . Stress: Not on  file  Relationships  . Social connections:    Talks on phone: Not on file    Gets together: Not on file    Attends religious service: Not on file    Active member of club or organization: Not on file    Attends meetings of clubs or organizations: Not on file    Relationship status: Not on file  . Intimate partner violence:    Fear of current or ex partner: Not on file    Emotionally abused: Not on file    Physically abused: Not on file    Forced sexual activity: Not on file  Other Topics Concern  . Not on file  Social History Narrative  . Not on file   Family History  Problem Relation Age of Onset  . Diabetes Mother   . Hypertension Mother   . Stroke Mother   . Heart disease Mother   . Heart disease Father   . Stroke Father   . Depression Father   . Alzheimer's disease Father   . Diabetes Sister   . Diabetes Brother   . Hyperlipidemia Brother     Objective: Office vital signs reviewed. BP 125/74   Pulse 64   Temp 98.1 F (36.7 C)   LMP 08/24/2016   Physical Examination:  General: Awake, alert, thin female, No acute distress HEENT: Normal, sclera white, MMM Cardio: regular rate and rhythm, S1S2 heard, no murmurs appreciated Pulm: clear to auscultation bilaterally, no wheezes, rhonchi or rales; normal work of breathing on room air MSK: requires wheelchair for mobility. Skin: dry; tanned  Assessment/ Plan: 52 y.o. female   1. Abnormal cortisol level Was able to review the note from endocrinology this past spring.  She has been worked up for abnormal  cortisol level.  Per her report there were additional encounters over the telephone but these are not visible in care everywhere.  She was started on an unknown medication for symptoms which during her visit she noted she is only been taking for the last couple of days for fear that she may have an allergic reaction to it.  So far she is tolerating the medicine without difficulty.  I advised her to continue the medication and make a follow-up appointment with endocrinology for recheck.  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  We discussed foods to maintain sugar levels overnight including peanut butter.  She knows that she should seek immediate medical attention should she have repeat hypoglycemic episodes.  2. Hypoglycemia - CMP14+EGFR  3. Neuromuscular disorder (HCC) - CBC with Differential - CMP14+EGFR  4. Medication monitoring encounter Patient on Topamax and Lamictal, will check CBC and CMP for monitoring of these medications. - CBC with Differential - CMP14+EGFR   Orders Placed This Encounter  Procedures  . CBC with Differential  . [place], [place] [phone]

## 2018-02-17 ENCOUNTER — Encounter: Payer: Self-pay | Admitting: Family Medicine

## 2018-02-26 ENCOUNTER — Other Ambulatory Visit: Payer: Self-pay | Admitting: Family Medicine

## 2018-03-02 ENCOUNTER — Other Ambulatory Visit: Payer: Self-pay

## 2018-03-02 MED ORDER — ATENOLOL 25 MG PO TABS: 25.0000 mg | ORAL_TABLET | Freq: Every day | ORAL | 1 refills | Status: DC

## 2018-03-16 ENCOUNTER — Ambulatory Visit (INDEPENDENT_AMBULATORY_CARE_PROVIDER_SITE_OTHER): Payer: Managed Care, Other (non HMO) | Admitting: Family

## 2018-03-16 ENCOUNTER — Encounter: Payer: Self-pay | Admitting: Family

## 2018-03-16 VITALS — BP 95/57 | HR 66 | Temp 98.6°F | Ht 67.0 in | Wt 109.0 lb

## 2018-03-16 DIAGNOSIS — R609 Edema, unspecified: Secondary | ICD-10-CM | POA: Diagnosis not present

## 2018-03-16 DIAGNOSIS — I6990 Unspecified sequelae of unspecified cerebrovascular disease: Secondary | ICD-10-CM

## 2018-03-16 DIAGNOSIS — G709 Myoneural disorder, unspecified: Secondary | ICD-10-CM

## 2018-03-16 NOTE — Patient Instructions (Signed)
Edema Edema is an abnormal buildup of fluids in your bodytissues. Edema is somewhatdependent on gravity to pull the fluid to the lowest place in your body. That makes the condition more common in the legs and thighs (lower extremities). Painless swelling of the feet and ankles is common and becomes more likely as you get older. It is also common in looser tissues, like around your eyes. When the affected area is squeezed, the fluid may move out of that spot and leave a dent for a few moments. This dent is called pitting. What are the causes? There are many possible causes of edema. Eating too much salt and being on your feet or sitting for a long time can cause edema in your legs and ankles. Hot weather may make edema worse. Common medical causes of edema include:  Heart failure.  Liver disease.  Kidney disease.  Weak blood vessels in your legs.  Cancer.  An injury.  Pregnancy.  Some medications.  Obesity.  What are the signs or symptoms? Edema is usually painless.Your skin may look swollen or shiny. How is this diagnosed? Your health care provider may be able to diagnose edema by asking about your medical history and doing a physical exam. You may need to have tests such as X-rays, an electrocardiogram, or blood tests to check for medical conditions that may cause edema. How is this treated? Edema treatment depends on the cause. If you have heart, liver, or kidney disease, you need the treatment appropriate for these conditions. General treatment may include:  Elevation of the affected body part above the level of your heart.  Compression of the affected body part. Pressure from elastic bandages or support stockings squeezes the tissues and forces fluid back into the blood vessels. This keeps fluid from entering the tissues.  Restriction of fluid and salt intake.  Use of a water pill (diuretic). These medications are appropriate only for some types of edema. They pull fluid  out of your body and make you urinate more often. This gets rid of fluid and reduces swelling, but diuretics can have side effects. Only use diuretics as directed by your health care provider.  Follow these instructions at home:  Keep the affected body part above the level of your heart when you are lying down.  Do not sit still or stand for prolonged periods.  Do not put anything directly under your knees when lying down.  Do not wear constricting clothing or garters on your upper legs.  Exercise your legs to work the fluid back into your blood vessels. This may help the swelling go down.  Wear elastic bandages or support stockings to reduce ankle swelling as directed by your health care provider.  Eat a low-salt diet to reduce fluid if your health care provider recommends it.  Only take medicines as directed by your health care provider. Contact a health care provider if:  Your edema is not responding to treatment.  You have heart, liver, or kidney disease and notice symptoms of edema.  You have edema in your legs that does not improve after elevating them.  You have sudden and unexplained weight gain. Get help right away if:  You develop shortness of breath or chest pain.  You cannot breathe when you lie down.  You develop pain, redness, or warmth in the swollen areas.  You have heart, liver, or kidney disease and suddenly get edema.  You have a fever and your symptoms suddenly get worse. This information is   not intended to replace advice given to you by your health care provider. Make sure you discuss any questions you have with your health care provider. Document Released: 07/06/2005 Document Revised: 12/12/2015 Document Reviewed: 04/28/2013 Elsevier Interactive Patient Education  2017 Elsevier Inc.  

## 2018-03-16 NOTE — Progress Notes (Signed)
   Subjective:    Patient ID: Deborah Meyer, female    DOB: July 13, 1966, 52 y.o.   MRN: [id number]  Chief Complaint  Patient presents with  . Leg Swelling    HPI PT presents to the office today with bilateral leg swelling that started several weeks ago. She states in the morning her swelling goes down, but by the time she stands on her legs all day she reports she has moderate amount of swelling. She has not had any changes in her medications or diet recently.   She states she has SOB and tachycardia at times. She is followed by Cardiologists every 6 months.    Review of Systems  All other systems reviewed and are negative.      Objective:   Physical Exam  Constitutional: She is oriented to person, place, and time. She appears well-developed and well-nourished. No distress.  HENT:  Head: Normocephalic and atraumatic.  Right Ear: External ear normal.  Mouth/Throat: Oropharynx is clear and moist.  Eyes: Pupils are equal, round, and reactive to light.  Neck: Normal range of motion. Neck supple. No thyromegaly present.  Cardiovascular: Normal rate, regular rhythm, normal heart sounds and intact distal pulses.  No murmur heard. Pulmonary/Chest: Effort normal and breath sounds normal. No respiratory distress. She has no wheezes.  Abdominal: Soft. Bowel sounds are normal. She exhibits no distension. There is no tenderness.  Musculoskeletal: She exhibits no edema or tenderness.  Generalized weakness, pt in wheelchair, unsteady gait   Neurological: She is alert and oriented to person, place, and time. She displays atrophy. No cranial nerve deficit. Gait abnormal.  Reflex Scores:      Tricep reflexes are 1+ on the right side and 2+ on the left side.      Bicep reflexes are 1+ on the right side and 2+ on the left side.      Achilles reflexes are 1+ on the right side and 2+ on the left side. Skin: Skin is warm and dry.  Psychiatric: She has a normal mood and affect. Her behavior is  normal. Judgment and thought content normal.  Vitals reviewed.    BP (!) 95/57   Pulse 66   Temp 98.6 F (37 C) (Oral)   Ht 5\' 7"  (1.702 m)   Wt 109 lb (49.4 kg)   LMP 08/24/2016   BMI 17.07 kg/m      Assessment & Plan:  Deborah Meyer comes in today with chief complaint of Leg Swelling   Diagnosis and orders addressed:  1. Peripheral edema Keep legs elevated when possible  Low salt diet Compression stockings On exam today, no swelling present - Compression stockings  2. Neuromuscular disorder (HCC) - Compression stockings  3. Late effects of CVA (cerebrovascular accident) - Compression stockings   Long term disability paperwork completed today. Greater than 30 mins spent with patient discussing problems and completing necessary paperwork.   Follow up plan: Keep all specialists appts and follow up with PCP  Evelina Dun, FNP

## 2018-03-31 ENCOUNTER — Other Ambulatory Visit: Payer: Self-pay | Admitting: *Deleted

## 2018-03-31 MED ORDER — TICAGRELOR 90 MG PO TABS: 90.0000 mg | ORAL_TABLET | Freq: Two times a day (BID) | ORAL | 0 refills | Status: AC

## 2018-05-04 ENCOUNTER — Other Ambulatory Visit: Payer: Self-pay | Admitting: Family Medicine

## 2018-05-04 NOTE — Telephone Encounter (Signed)
Last lipid 07/06/17

## 2018-05-25 ENCOUNTER — Other Ambulatory Visit: Payer: Self-pay | Admitting: Family

## 2018-06-21 ENCOUNTER — Encounter: Payer: Self-pay | Admitting: Family Medicine

## 2018-06-21 ENCOUNTER — Ambulatory Visit (INDEPENDENT_AMBULATORY_CARE_PROVIDER_SITE_OTHER): Payer: Managed Care, Other (non HMO) | Admitting: Family Medicine

## 2018-06-21 VITALS — BP 114/67 | HR 58 | Temp 97.7°F

## 2018-06-21 DIAGNOSIS — M7062 Trochanteric bursitis, left hip: Secondary | ICD-10-CM

## 2018-06-21 DIAGNOSIS — G709 Myoneural disorder, unspecified: Secondary | ICD-10-CM

## 2018-06-21 DIAGNOSIS — Z7952 Long term (current) use of systemic steroids: Secondary | ICD-10-CM | POA: Diagnosis not present

## 2018-06-21 NOTE — Patient Instructions (Signed)
Try tart cherry juice for joint pain.  Use topical pain reliever of choice.  If symptoms persist, we can have you see the orthopedist.  Call me if you want this referral.  Trochanteric Bursitis Trochanteric bursitis is a condition that causes hip pain. Trochanteric bursitis happens when fluid-filled sacs (bursae) in the hip get irritated. Normally these sacs absorb shock and help strong bands of tissue (tendons) in your hip glide smoothly over each other and over your hip bones. What are the causes? This condition results from increased friction between the hip bones and the tendons that go over them. This condition can happen if you:  Have weak hips.  Use your hip muscles too much (overuse).  Get hit in the hip.  What increases the risk? This condition is more likely to develop in:  Women.  Adults who are middle-aged or older.  People with arthritis or a spinal condition.  People with weak buttocks muscles (gluteal muscles).  People who have one leg that is shorter than the other.  People who participate in certain kinds of athletic activities, such as: ? Running sports, especially long-distance running. ? Contact sports, like football or martial arts. ? Sports in which falls may occur, like skiing.  What are the signs or symptoms? The main symptom of this condition is pain and tenderness over the point of your hip. The pain may be:  Sharp and intense.  Dull and achy.  Felt on the outside of your thigh.  It may increase when you:  Lie on your side.  Walk or run.  Go up on stairs.  Sit.  Stand up after sitting.  Stand for long periods of time.  How is this diagnosed? This condition may be diagnosed based on:  Your symptoms.  Your medical history.  A physical exam.  Imaging tests, such as: ? X-rays to check your bones. ? An MRI or ultrasound to check your tendons and muscles.  During your physical exam, your health care provider will check the  movement and strength of your hip. He or she may press on the point of your hip to check for pain. How is this treated? This condition may be treated by:  Resting.  Reducing your activity.  Avoiding activities that cause pain.  Using crutches, a cane, or a walker to decrease the strain on your hip.  Taking medicine to help with swelling.  Having medicine injected into the bursae to help with swelling.  Using ice, heat, and massage therapy for pain relief.  Physical therapy exercises for strength and flexibility.  Surgery (rare).  Follow these instructions at home: Activity  Rest.  Avoid activities that cause pain.  Return to your normal activities as told by your health care provider. Ask your health care provider what activities are safe for you. Managing pain, stiffness, and swelling  Take over-the-counter and prescription medicines only as told by your health care provider.  If directed, apply heat to the injured area as told by your health care provider. ? Place a towel between your skin and the heat source. ? Leave the heat on for 20-30 minutes. ? Remove the heat if your skin turns bright red. This is especially important if you are unable to feel pain, heat, or cold. You may have a greater risk of getting burned.  If directed, apply ice to the injured area: ? Put ice in a plastic bag. ? Place a towel between your skin and the bag. ? Leave the ice  on for 20 minutes, 2-3 times a day. General instructions  If the affected leg is one that you use for driving, ask your health care provider when it is safe to drive.  Use crutches, a cane, or a walker as told by your health care provider.  If one of your legs is shorter than the other, get fitted for a shoe insert.  Lose weight if you are overweight. How is this prevented?  Wear supportive footwear that is appropriate for your sport.  If you have hip pain, start any new exercise or sport slowly.  Maintain  physical fitness, including: ? Strength. ? Flexibility. Contact a health care provider if:  Your pain does not improve with 2-4 weeks. Get help right away if:  You develop severe pain.  You have a fever.  You develop increased redness over your hip.  You have a change in your bowel function or bladder function.  You cannot control the muscles in your feet. This information is not intended to replace advice given to you by your health care provider. Make sure you discuss any questions you have with your health care provider. Document Released: 08/13/2004 Document Revised: 03/11/2016 Document Reviewed: 06/21/2015 Elsevier Interactive Patient Education  [employer].

## 2018-06-21 NOTE — Progress Notes (Signed)
Subjective: CC: hip pain PCP: Janora Norlander, DO QVZ:DGLOVFIE A Cui is a 52 y.o. female presenting to clinic today for:  1. Hip pain Patient reports a several week history of left-sided hip pain that is worse with most positions.  She does find that it is painful to sleep on and therefore she has been sleeping on the right side.  She is currently treated by her specialist with Norco.  She does find that it works but often wears off after about 6 hours.  She is reluctant to continue taking the Norco secondary to drowsiness and not wanting to be dependent on the medicine.  She also self injects corticosteroids every 3 days for her muscular disorder.  Denies any preceding injury.  She does have a history of recurrent falls and instability.  2.  Osteoporosis Patient reports that she has been undergoing bone density scans every few years with her specialist.  She is currently compliant with Boniva monthly.  She is unable to tolerate vitamin D or calcium by mouth.  She attempts to get this through her diet but states that she is dairy intolerant.  She is a vegetarian.  She tries to eat things that are fortified with calcium and vitamin D.  She was recently told by her dentist that her jaw looks somewhat brittle.  She is on chronic corticosteroids as above.  ROS: Per HPI  Allergies  Allergen Reactions  . Bee Venom Anaphylaxis  . Ciprofloxacin Hcl Hives, Itching and Swelling  . Contrast Media [Iodinated Diagnostic Agents] Shortness Of Breath    Pt reports nausea, SOB, "passing out" during IV contrast administration in the past. Pt reports nausea, SOB, "passing out" during IV contrast administration in the past.  . Lorazepam Other (See Comments) and Shortness Of Breath  . Methylprednisolone Sodium Succ Shortness Of Breath  . Morphine And Related Shortness Of Breath and Other (See Comments)    "Blood pressure bottoms out"   . Mushroom Extract Complex Other (See Comments)    "Stopped  breathing"  . Topiramate Er Hypertension and Shortness Of Breath  . Ciprofloxacin Hives  . Lac Bovis Rash  . Protonix  [Pantoprazole Sodium] Nausea And Vomiting  . Dairy Aid [Lactase] Nausea And Vomiting  . Influenza Vaccines   . Lyrica [Pregabalin] Nausea And Vomiting    Caused extreme high blood pressure and sycotic episodes.    . Toradol [Ketorolac Tromethamine] Nausea And Vomiting and Other (See Comments)    "passed out"  . Xanax Xr [Alprazolam Er] Hypertension    hallucinations  . Asa [Aspirin] Nausea And Vomiting and Rash  . Penicillins Rash  . Prednisone Rash  . Tramadol Rash   Past Medical History:  Diagnosis Date  . Asthma   . COPD (chronic obstructive pulmonary disease) ([place])   . Dyspnea on exertion   . Epilepsy with partial complex seizures ([place])   . Heart attack ([place])   . Heart murmur   . Hypertension   . Low iron    hx of iron supplementation  . Migraine   . Neuromuscular disorder ([place])    frequent falls  . Neuromuscular disorder ([place])   . Normal cardiac stress test 02/2014   low risk stress echo  . Osteoma    craniotomy 2006  . Progressive multifocal leukoencephalopathy   . Sleep apnea   . Stroke ([place])   . Thiamin deficiency   . Vitamin D deficiency     Current Outpatient Medications:  .  albuterol (PROVENTIL HFA;VENTOLIN  HFA) 108 (90 BASE) MCG/ACT inhaler, Inhale 2 puffs into the lungs every 6 (six) hours as needed for wheezing., Disp: , Rfl:  .  Armodafinil (NUVIGIL) 250 MG tablet, Take 250 mg by mouth daily., Disp: , Rfl:  .  atenolol (TENORMIN) 25 MG tablet, Take 1 tablet (25 mg total) by mouth daily., Disp: 90 tablet, Rfl: 1 .  atorvastatin (LIPITOR) 40 MG tablet, TAKE 1 TABLET BY MOUTH EVERY DAY, Disp: 90 tablet, Rfl: 0 .  baclofen (LIORESAL) 20 MG tablet, TAKE 1 TABLET BY MOUTH 2 TIMES A DAY AS NEEDED FOR MUSCLE SPASMS, Disp: 60 tablet, Rfl: 11 .  clonazePAM (KLONOPIN) 0.5 MG tablet, Take 0.5 mg by mouth at bedtime., Disp: , Rfl:  .  Coenzyme  Q10 50 MG CAPS, Take 1 capsule by mouth daily. , Disp: , Rfl:  .  cyanocobalamin (,VITAMIN B-12,) 1000 MCG/ML injection, Inject 1,000 mcg into the muscle once a week. On Sunday., Disp: , Rfl:  .  diazepam (VALIUM) 5 MG tablet, Take 1 tablet by mouth every 6 (six) hours as needed for anxiety. , Disp: , Rfl: 0 .  donepezil (ARICEPT) 10 MG tablet, Take 1 tablet by mouth daily. , Disp: , Rfl:  .  EPINEPHrine 0.3 mg/0.3 mL IJ SOAJ injection, inject 0.3 ML into THE muscle ONCE as needed FOR anaphylaxis, Disp: , Rfl: 0 .  fluticasone-salmeterol (ADVAIR HFA) 230-21 MCG/ACT inhaler, Inhale 2 puffs into the lungs 2 (two) times daily., Disp: 1 Inhaler, Rfl: 12 .  HYDROcodone-acetaminophen (NORCO/VICODIN) 5-325 MG per tablet, Take 1 tablet by mouth every 6 (six) hours as needed for pain., Disp: , Rfl:  .  ibandronate (BONIVA) 150 MG tablet, Take 1 tablet by mouth every 30 (thirty) days., Disp: , Rfl:  .  isosorbide dinitrate (ISORDIL) 10 MG tablet, isosorbide dinitrate 10 mg tablet  take 1  Tablet by mouth 3 times daily, Disp: , Rfl:  .  labetalol (NORMODYNE) 100 MG tablet, TAKE ONE-HALF TABLET BY MOUTH EVERY 12 HOURS, Disp: 90 tablet, Rfl: 0 .  lamoTRIgine (LAMICTAL) 200 MG tablet, Take 200 mg by mouth 2 (two) times daily., Disp: , Rfl: 6 .  MethylPREDNISolone Acetate (DEPO-MEDROL IJ), Inject 10 mg as directed every 3 (three) days., Disp: , Rfl:  .  montelukast (SINGULAIR) 10 MG tablet, Take 10 mg by mouth at bedtime., Disp: , Rfl:  .  naproxen (NAPROSYN) 500 MG tablet, Take 1 tablet by mouth 2 (two) times daily as needed for mild pain. , Disp: , Rfl:  .  nitroGLYCERIN (NITROSTAT) 0.4 MG SL tablet, Place 1 tablet (0.4 mg total) under the tongue every 5 (five) minutes., Disp: 30 tablet, Rfl: 1 .  Omega-3 Fatty Acids (FISH OIL) 1000 MG CAPS, Take 1,000 mg by mouth daily. , Disp: , Rfl:  .  ondansetron (ZOFRAN) 8 MG tablet, Take 8 mg by mouth every 8 (eight) hours as needed. for nausea, Disp: , Rfl: 0 .   ticagrelor (BRILINTA) 90 MG TABS tablet, Take 1 tablet (90 mg total) by mouth 2 (two) times daily., Disp: 180 tablet, Rfl: 0 .  Tiotropium Bromide Monohydrate (SPIRIVA RESPIMAT IN), Spiriva Respimat 1.25 mcg/actuation solution for inhalation, Disp: , Rfl:  .  traZODone (DESYREL) 100 MG tablet, Take 100 mg by mouth at bedtime., Disp: , Rfl: 6 .  TROKENDI XR 100 MG CP24, Take 1 capsule by mouth daily., Disp: , Rfl: 6 .  vitamin E 400 UNIT capsule, Take 400 Units by mouth daily., Disp: , Rfl:  Social History   Socioeconomic History  . Marital status: Married    Spouse name: Not on file  . Number of children: Not on file  . Years of education: Not on file  . Highest education level: Not on file  Occupational History  . Not on file  Social Needs  . Financial resource strain: Not on file  . Food insecurity:    Worry: Not on file    Inability: Not on file  . Transportation needs:    Medical: Not on file    Non-medical: Not on file  Tobacco Use  . Smoking status: Never Smoker  . Smokeless tobacco: Never Used  Substance and Sexual Activity  . Alcohol use: No  . Drug use: No  . Sexual activity: Yes  Lifestyle  . Physical activity:    Days per week: Not on file    Minutes per session: Not on file  . Stress: Not on file  Relationships  . Social connections:    Talks on phone: Not on file    Gets together: Not on file    Attends religious service: Not on file    Active member of club or organization: Not on file    Attends meetings of clubs or organizations: Not on file    Relationship status: Not on file  . Intimate partner violence:    Fear of current or ex partner: Not on file    Emotionally abused: Not on file    Physically abused: Not on file    Forced sexual activity: Not on file  Other Topics Concern  . Not on file  Social History Narrative  . Not on file   Family History  Problem Relation Age of Onset  . Diabetes Mother   . Hypertension Mother   . Stroke Mother     . Heart disease Mother   . Heart disease Father   . Stroke Father   . Depression Father   . Alzheimer's disease Father   . Diabetes Sister   . Diabetes Brother   . Hyperlipidemia Brother     Objective: Office vital signs reviewed. BP 114/67   Pulse (!) 58   Temp 97.7 F (36.5 C) (Oral)   LMP 08/24/2016   Physical Examination:  General: Awake, alert, thin, No acute distress Cardio: regular rate and rhythm, S1S2 heard, no murmurs appreciated Pulm: clear to auscultation bilaterally, no wheezes, rhonchi or rales; normal work of breathing on room air MSK: wheelchair dependent, muscle tone fair; she has point tenderness over the left hip over the trochanteric bursa  Assessment/ Plan: 52 y.o. female   1. Trochanteric bursitis of left hip Physical clinically consistent with bursitis of the trochanteric bursa.  We discussed that she is already being treated with corticosteroids and pain relievers.  We discussed home care remedies and a handout was provided.  If symptoms are persistent, we could consider performing a corticosteroid injection directed to the bursa that I am somewhat concerned given ongoing osteoporosis and chronic need for steroids.  Will try conservative measures first and if no improvement plan for referral to Ortho.  2. Neuromuscular disorder ([place]) She will look into see if she has had a recent bone density scan.  I placed an order for this to be done here.  Check vitamin D, PTH and calcium. - DG [facility] DEXA; Future - VITAMIN D 25 Hydroxy (Vit-D Deficiency, Fractures) - PTH, Intact and Calcium  3. Current chronic use of systemic steroids - DG [facility] DEXA;  Future - VITAMIN D 25 Hydroxy (Vit-D Deficiency, Fractures) - PTH, Intact and Calcium   Orders Placed This Encounter  Procedures  . DG [facility] DEXA    Standing Status:   Future    Standing Expiration Date:   08/23/2019    Order Specific Question:   Reason for Exam (SYMPTOM  OR DIAGNOSIS REQUIRED)    Answer:    chronic steroids.    Order Specific Question:   Is the patient pregnant?    Answer:   No  . VITAMIN D 25 Hydroxy (Vit-D Deficiency, Fractures)  . PTH, Intact and Calcium   No orders of the defined types were placed in this encounter.    Janora Norlander, DO [place] [phone]

## 2018-06-22 LAB — PTH, INTACT AND CALCIUM
CALCIUM: 9.3 mg/dL (ref 8.7–10.2)
PTH: 25 pg/mL (ref 15–65)

## 2018-06-22 LAB — VITAMIN D 25 HYDROXY (VIT D DEFICIENCY, FRACTURES): Vit D, 25-Hydroxy: 22.1 ng/mL — ABNORMAL LOW (ref 30.0–100.0)

## 2018-07-18 ENCOUNTER — Ambulatory Visit (INDEPENDENT_AMBULATORY_CARE_PROVIDER_SITE_OTHER): Payer: Managed Care, Other (non HMO) | Admitting: Pediatrics

## 2018-07-18 ENCOUNTER — Encounter: Payer: Self-pay | Admitting: Pediatrics

## 2018-07-18 VITALS — BP 113/68 | HR 68 | Temp 97.8°F | Ht 68.0 in

## 2018-07-18 DIAGNOSIS — H109 Unspecified conjunctivitis: Secondary | ICD-10-CM

## 2018-07-18 DIAGNOSIS — J0100 Acute maxillary sinusitis, unspecified: Secondary | ICD-10-CM

## 2018-07-18 MED ORDER — POLYMYXIN B-TRIMETHOPRIM 10000-0.1 UNIT/ML-% OP SOLN: 1.0000 [drp] | OPHTHALMIC | 0 refills | Status: DC

## 2018-07-18 MED ORDER — DOXYCYCLINE HYCLATE 100 MG PO TABS: 100.0000 mg | ORAL_TABLET | Freq: Two times a day (BID) | ORAL | 0 refills | Status: DC

## 2018-07-18 NOTE — Progress Notes (Signed)
  Subjective:   Patient ID: Deborah Meyer, female    DOB: 26-Jun-1966, 52 y.o.   MRN: [id number] CC: Eye Drainage (4 days) and Nasal Congestion (1 week)  HPI: Deborah Meyer is a 52 y.o. female   URI symptoms started about 6 to 7 days ago.  Feels film come over her eye every 30 min or so during the day.  Thick nasal discharge over the last few days.  No fevers.  Does have some pressure in her sinuses.  Appetite is unchanged, remains down from other chronic illnesses.  No shortness of breath or trouble breathing.  Minimal cough.  No sore throat.  Relevant past medical, surgical, family and social history reviewed. Allergies and medications reviewed and updated. Social History   Tobacco Use  Smoking Status Never Smoker  Smokeless Tobacco Never Used   ROS: Per HPI   Objective:    BP 113/68   Pulse 68   Temp 97.8 F (36.6 C) (Oral)   Ht 5\' 8"  (1.727 m)   LMP 08/24/2016   BMI 16.57 kg/m   Wt Readings from Last 3 Encounters:  03/16/18 109 lb (49.4 kg)  11/18/17 105 lb (47.6 kg)  11/15/17 105 lb (47.6 kg)    Gen: NAD, alert, cooperative with exam, NCAT EYES: EOMI, slight bilateral conjunctival injection, or no icterus ENT:  TMs dull gray b/l, OP without erythema LYMPH: no cervical LAD CV: NRRR, normal S1/S2, no murmur, distal pulses 2+ b/l Resp: CTABL, no wheezes, normal WOB Ext: No edema, warm Neuro: Alert and oriented MSK: normal muscle bulk  Assessment & Plan:  Labella was seen today for eye drainage and nasal congestion.  Diagnoses and all orders for this visit:  Bacterial conjunctivitis of both eyes Start below. -     trimethoprim-polymyxin b (POLYTRIM) ophthalmic solution; Place 1 drop into both eyes every 4 (four) hours.  Acute non-recurrent maxillary sinusitis Acute URI symptoms, treatment discussed.  If she starts to improve and then worsen, should start below.  Start sinus rinses with distilled water, Flonase, antihistamine. -     doxycycline  (VIBRA-TABS) 100 MG tablet; Take 1 tablet (100 mg total) by mouth 2 (two) times daily.   Follow up plan: As needed.  Return precautions discussed. Assunta Found, MD [place]

## 2018-07-18 NOTE — Patient Instructions (Signed)
Fever reducer and headache: tylenol   Sinus pressure:  Nasal steroid such as flonase/fluticaone or nasocort daily Can also take daily antihistamine such as loratadine/claritin or cetirizine/zyrtec  Sinus rinses/irritation: Netipot or similar with distilled water 2-3 times a day to clear out sinuses or Normal saline nasal spray  Sore throat:  Throat lozenges chloroseptic spray  Stick with bland foods Drink lots of fluids  

## 2018-08-16 ENCOUNTER — Other Ambulatory Visit: Payer: Self-pay | Admitting: Family Medicine

## 2018-08-23 DIAGNOSIS — Z029 Encounter for administrative examinations, unspecified: Secondary | ICD-10-CM

## 2018-08-29 ENCOUNTER — Other Ambulatory Visit: Payer: Self-pay | Admitting: Nurse Practitioner

## 2018-08-29 NOTE — Telephone Encounter (Signed)
Ov 09/20/18

## 2018-08-30 ENCOUNTER — Other Ambulatory Visit: Payer: Self-pay | Admitting: *Deleted

## 2018-08-30 MED ORDER — BACLOFEN 20 MG PO TABS: ORAL_TABLET | ORAL | 2 refills | Status: DC

## 2018-09-05 ENCOUNTER — Ambulatory Visit (INDEPENDENT_AMBULATORY_CARE_PROVIDER_SITE_OTHER): Payer: Managed Care, Other (non HMO) | Admitting: Family

## 2018-09-05 ENCOUNTER — Encounter: Payer: Self-pay | Admitting: Family

## 2018-09-05 VITALS — BP 115/61 | HR 87 | Temp 101.9°F | Ht 68.0 in | Wt 106.0 lb

## 2018-09-05 DIAGNOSIS — J208 Acute bronchitis due to other specified organisms: Secondary | ICD-10-CM | POA: Diagnosis not present

## 2018-09-05 DIAGNOSIS — R6889 Other general symptoms and signs: Secondary | ICD-10-CM | POA: Diagnosis not present

## 2018-09-05 DIAGNOSIS — J101 Influenza due to other identified influenza virus with other respiratory manifestations: Secondary | ICD-10-CM | POA: Diagnosis not present

## 2018-09-05 DIAGNOSIS — B9689 Other specified bacterial agents as the cause of diseases classified elsewhere: Secondary | ICD-10-CM | POA: Diagnosis not present

## 2018-09-05 LAB — VERITOR FLU A/B WAIVED
INFLUENZA B: NEGATIVE
Influenza A: POSITIVE — AB

## 2018-09-05 MED ORDER — BENZONATATE 200 MG PO CAPS: 200.0000 mg | ORAL_CAPSULE | Freq: Three times a day (TID) | ORAL | 1 refills | Status: DC | PRN

## 2018-09-05 MED ORDER — AZITHROMYCIN 250 MG PO TABS: ORAL_TABLET | ORAL | 0 refills | Status: DC

## 2018-09-05 MED ORDER — OSELTAMIVIR PHOSPHATE 75 MG PO CAPS: 75.0000 mg | ORAL_CAPSULE | Freq: Two times a day (BID) | ORAL | 0 refills | Status: DC

## 2018-09-05 NOTE — Patient Instructions (Signed)

## 2018-09-05 NOTE — Progress Notes (Signed)
Subjective:    Patient ID: Deborah Meyer, female    DOB: Sep 21, 1965, 53 y.o.   MRN: [id number]  Chief Complaint  Patient presents with  . Cough    chestr congestion  . Fever  . Chills  . Nausea  . Generalized Body Aches  . Headache    Cough  This is a new problem. The current episode started in the past 7 days. The problem has been waxing and waning. The problem occurs every few minutes. The cough is productive of purulent sputum and productive of brown sputum. Associated symptoms include chills, a fever, headaches, myalgias, nasal congestion, postnasal drip, a sore throat, shortness of breath and wheezing. Pertinent negatives include no ear congestion or ear pain. The symptoms are aggravated by lying down. She has tried rest and OTC cough suppressant for the symptoms. The treatment provided mild relief. There is no history of COPD.      Review of Systems  Constitutional: Positive for chills and fever.  HENT: Positive for postnasal drip and sore throat. Negative for ear pain.   Respiratory: Positive for cough, shortness of breath and wheezing.   Musculoskeletal: Positive for myalgias.  Neurological: Positive for headaches.  All other systems reviewed and are negative.      Objective:   Physical Exam Vitals signs reviewed.  Constitutional:      General: She is not in acute distress.    Appearance: She is well-developed. She is ill-appearing.  HENT:     Head: Normocephalic and atraumatic.     Right Ear: External ear normal.     Nose: Mucosal edema present.     Mouth/Throat:     Pharynx: Posterior oropharyngeal erythema present.  Eyes:     Pupils: Pupils are equal, round, and reactive to light.  Neck:     Musculoskeletal: Normal range of motion and neck supple.     Thyroid: No thyromegaly.  Cardiovascular:     Rate and Rhythm: Normal rate and regular rhythm.     Heart sounds: Normal heart sounds. No murmur.  Pulmonary:     Effort: Pulmonary effort is normal. No  respiratory distress.     Breath sounds: Normal breath sounds. No wheezing.  Abdominal:     General: Bowel sounds are normal. There is no distension.     Palpations: Abdomen is soft.     Tenderness: There is no abdominal tenderness.  Musculoskeletal:        General: No tenderness.     Comments: Generalized weakness, in wheelchair  Skin:    General: Skin is warm and dry.  Neurological:     Mental Status: She is alert and oriented to person, place, and time.     Cranial Nerves: No cranial nerve deficit.     Deep Tendon Reflexes: Reflexes are normal and symmetric.  Psychiatric:        Behavior: Behavior normal.        Thought Content: Thought content normal.        Judgment: Judgment normal.       BP 115/61   Pulse 87   Temp (!) 101.9 F (38.8 C) (Oral)   Ht 5\' 8"  (1.727 m)   Wt 106 lb (48.1 kg)   LMP 08/24/2016   BMI 16.12 kg/m      Assessment & Plan:  Deborah Meyer comes in today with chief complaint of Cough (chestr congestion); Fever; Chills; Nausea; Generalized Body Aches; and Headache   Diagnosis and orders addressed:  1.  Flu-like symptoms - Veritor Flu A/B Waived - benzonatate (TESSALON) 200 MG capsule; Take 1 capsule (200 mg total) by mouth 3 (three) times daily as needed.  Dispense: 30 capsule; Refill: 1  2. Influenza A Rest Force fluids Tylenol or morin prn Droplet precautions discussed - oseltamivir (TAMIFLU) 75 MG capsule; Take 1 capsule (75 mg total) by mouth 2 (two) times daily.  Dispense: 10 capsule; Refill: 0 - benzonatate (TESSALON) 200 MG capsule; Take 1 capsule (200 mg total) by mouth 3 (three) times daily as needed.  Dispense: 30 capsule; Refill: 1  3. Acute bacterial bronchitis - Take meds as prescribed - Use a cool mist humidifier  -Use saline nose sprays frequently -Force fluids -For any cough or congestion  Use plain Mucinex- regular strength or max strength is fine -For fever or aces or pains- take tylenol or ibuprofen. -Throat  lozenges if help -RTO if symptoms worsen or do not improve  - azithromycin (ZITHROMAX Z-PAK) 250 MG tablet; As directed  Dispense: 1 each; Refill: 0 - benzonatate (TESSALON) 200 MG capsule; Take 1 capsule (200 mg total) by mouth 3 (three) times daily as needed.  Dispense: 30 capsule; Refill: [place], FNP

## 2018-09-20 ENCOUNTER — Ambulatory Visit: Payer: Managed Care, Other (non HMO) | Admitting: Family Medicine

## 2018-09-20 ENCOUNTER — Ambulatory Visit (INDEPENDENT_AMBULATORY_CARE_PROVIDER_SITE_OTHER): Payer: Managed Care, Other (non HMO)

## 2018-09-20 VITALS — BP 89/54 | HR 66 | Temp 97.4°F

## 2018-09-20 DIAGNOSIS — G709 Myoneural disorder, unspecified: Secondary | ICD-10-CM

## 2018-09-20 DIAGNOSIS — R0609 Other forms of dyspnea: Secondary | ICD-10-CM

## 2018-09-20 DIAGNOSIS — M25552 Pain in left hip: Secondary | ICD-10-CM | POA: Diagnosis not present

## 2018-09-20 DIAGNOSIS — G40909 Epilepsy, unspecified, not intractable, without status epilepticus: Secondary | ICD-10-CM

## 2018-09-20 NOTE — Progress Notes (Addendum)
Subjective: CC: Dyspnea on exertion  PCP: Janora Norlander, DO WIO:[id number] Deborah Meyer is Deborah 53 y.o. female presenting to clinic today for:  1. Dyspnea on exertion Patient reports shortness of breath with exertion which has been ongoing for over 1 month now.  She has known lung disease which is complicated by neuromuscular disease.  She was last seen by pulmonology in February and there were plans to add equipment to her CPAP machine to further evaluate her lung dysfunction.  There was concerned that her diaphragm was malfunctioning and possibly contributing to her symptoms.  She notes that over the last few weeks she is had waxing and waning pulse ox into the 80s.  She reports compliance with her inhalers.  No hemoptysis, fevers.  She did have influenza in February.  2.  Epilepsy Patient continues to be followed by neurology for this.  She recently had her Lamictal increased because she was having breakthrough seizure activity and had quite Deborah bit of big seizure activity on Deborah recent EEG.  She has follow-up with them soon.  They are collecting labs for monitoring of these medications per her report.  She continues to have small seizures that she describes as staring off spells Deborah couple of times per month.   3. Hip pain She continues to have left-sided hip pain that seems worse with certain activities.  She notes that it is becoming more frequent such that she has been using the Norco that is prescribed by her specialist.  She feels that there is Deborah clicking sensation in the left hip.  She describes the pain as sharp and stabbing that seems worse with activities such as rolling over in bed.  She forgot to mention it at her last visit with her specialist but will be seeing them soon.  ROS: Per HPI  Allergies  Allergen Reactions  . Bee Venom Anaphylaxis  . Ciprofloxacin Hcl Hives, Itching and Swelling  . Contrast Media [Iodinated Diagnostic Agents] Shortness Of Breath    Pt reports nausea, SOB,  "passing out" during IV contrast administration in the past. Pt reports nausea, SOB, "passing out" during IV contrast administration in the past.  . Lorazepam Other (See Comments) and Shortness Of Breath  . Methylprednisolone Sodium Succ Shortness Of Breath  . Morphine And Related Shortness Of Breath and Other (See Comments)    "Blood pressure bottoms out"   . Mushroom Extract Complex Other (See Comments)    "Stopped breathing"  . Topiramate Er Hypertension and Shortness Of Breath  . Ciprofloxacin Hives  . Lac Bovis Rash  . Protonix [Pantoprazole Sodium] Nausea And Vomiting    Raises blood pressure  . Dairy Aid [Lactase] Nausea And Vomiting  . Influenza Vaccines   . Lyrica [Pregabalin] Nausea And Vomiting    Caused extreme high blood pressure and sycotic episodes.    . Toradol [Ketorolac Tromethamine] Nausea And Vomiting and Other (See Comments)    "passed out"  . Xanax Xr [Alprazolam Er] Hypertension    hallucinations  . Asa [Aspirin] Nausea And Vomiting and Rash  . Penicillins Rash  . Prednisone Rash  . Tramadol Rash   Past Medical History:  Diagnosis Date  . Asthma   . COPD (chronic obstructive pulmonary disease) ([place])   . Dyspnea on exertion   . Epilepsy with partial complex seizures ([place])   . Heart attack ([place])   . Heart murmur   . Hypertension   . Low iron    hx of iron supplementation  .  Migraine   . Neuromuscular disorder ([place])    frequent falls  . Neuromuscular disorder ([place])   . Normal cardiac stress test 02/2014   low risk stress echo  . Osteoma    craniotomy 2006  . 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 Sleep apnea   . Stroke ([place])   . Thiamin deficiency   . Vitamin D deficiency     Current Outpatient Medications:  .  albuterol (PROVENTIL HFA;VENTOLIN HFA) 108 (90 BASE) MCG/ACT inhaler, Inhale 2 puffs into the lungs every 6 (six) hours as needed for wheezing., Disp: , Rfl:  .  Armodafinil (NUVIGIL) 250 MG tablet, Take 250 mg by mouth  daily., Disp: , Rfl:  .  atenolol (TENORMIN) 25 MG tablet, TAKE 1 TABLET BY MOUTH EVERY DAY, Disp: 90 tablet, Rfl: 0 .  atorvastatin (LIPITOR) 40 MG tablet, TAKE 1 TABLET BY MOUTH EVERY DAY, Disp: 90 tablet, Rfl: 0 .  baclofen (LIORESAL) 20 MG tablet, TAKE 1 TABLET BY MOUTH 2 TIMES Deborah DAY AS NEEDED FOR MUSCLE SPASMS, Disp: 60 tablet, Rfl: 2 .  clonazePAM (KLONOPIN) 0.5 MG tablet, Take 0.5 mg by mouth at bedtime., Disp: , Rfl:  .  Coenzyme Q10 50 MG CAPS, Take 1 capsule by mouth daily. , Disp: , Rfl:  .  cyanocobalamin (,VITAMIN B-12,) 1000 MCG/ML injection, Inject 1,000 mcg into the muscle once Deborah week. On Sunday., Disp: , Rfl:  .  diazepam (VALIUM) 5 MG tablet, Take 1 tablet by mouth every 6 (six) hours as needed for anxiety. , Disp: , Rfl: 0 .  donepezil (ARICEPT) 10 MG tablet, Take 1 tablet by mouth daily. , Disp: , Rfl:  .  doxycycline (VIBRA-TABS) 100 MG tablet, Take 1 tablet (100 mg total) by mouth 2 (two) times daily., Disp: 20 tablet, Rfl: 0 .  EMGALITY 120 MG/ML SOAJ, , Disp: , Rfl:  .  EPINEPHrine 0.3 mg/0.3 mL IJ SOAJ injection, inject 0.3 ML into THE muscle ONCE as needed FOR anaphylaxis, Disp: , Rfl: 0 .  fluticasone-salmeterol (ADVAIR HFA) 230-21 MCG/ACT inhaler, Inhale 2 puffs into the lungs 2 (two) times daily., Disp: 1 Inhaler, Rfl: 12 .  HYDROcodone-acetaminophen (NORCO/VICODIN) 5-325 MG per tablet, Take 1 tablet by mouth every 6 (six) hours as needed for pain., Disp: , Rfl:  .  ibandronate (BONIVA) 150 MG tablet, Take 1 tablet by mouth every 30 (thirty) days., Disp: , Rfl:  .  isosorbide dinitrate (ISORDIL) 10 MG tablet, isosorbide dinitrate 10 mg tablet  take 1  Tablet by mouth 3 times daily, Disp: , Rfl:  .  labetalol (NORMODYNE) 100 MG tablet, TAKE ONE-HALF TABLET BY MOUTH EVERY 12 HOURS, Disp: 90 tablet, Rfl: 0 .  lamoTRIgine (LAMICTAL) 200 MG tablet, Take 200 mg by mouth 2 (two) times daily., Disp: , Rfl: 6 .  MethylPREDNISolone Acetate (DEPO-MEDROL IJ), Inject 10 mg as  directed every 3 (three) days., Disp: , Rfl:  .  montelukast (SINGULAIR) 10 MG tablet, Take 10 mg by mouth at bedtime., Disp: , Rfl:  .  naproxen (NAPROSYN) 500 MG tablet, Take 1 tablet by mouth 2 (two) times daily as needed for mild pain. , Disp: , Rfl:  .  nitroGLYCERIN (NITROSTAT) 0.4 MG SL tablet, Place 1 tablet (0.4 mg total) under the tongue every 5 (five) minutes., Disp: 30 tablet, Rfl: 1 .  Omega-3 Fatty Acids (FISH OIL) 1000 MG CAPS, Take 1,000 mg by mouth daily. , Disp: , Rfl:  .  ondansetron (ZOFRAN) 8 MG tablet, Take  8 mg by mouth every 8 (eight) hours as needed. for nausea, Disp: , Rfl: 0 .  oseltamivir (TAMIFLU) 75 MG capsule, Take 1 capsule (75 mg total) by mouth 2 (two) times daily., Disp: 10 capsule, Rfl: 0 .  ticagrelor (BRILINTA) 90 MG TABS tablet, Take 1 tablet (90 mg total) by mouth 2 (two) times daily., Disp: 180 tablet, Rfl: 0 .  Tiotropium Bromide Monohydrate (SPIRIVA RESPIMAT IN), Spiriva Respimat 1.25 mcg/actuation solution for inhalation, Disp: , Rfl:  .  traZODone (DESYREL) 100 MG tablet, Take 100 mg by mouth at bedtime., Disp: , Rfl: 6 .  trimethoprim-polymyxin b (POLYTRIM) ophthalmic solution, Place 1 drop into both eyes every 4 (four) hours., Disp: 10 mL, Rfl: 0 .  TROKENDI XR 100 MG CP24, Take 1 capsule by mouth daily., Disp: , Rfl: 6 .  vitamin E 400 UNIT capsule, Take 400 Units by mouth daily., Disp: , Rfl:  Social History   Socioeconomic History  . Marital status: Married    Spouse name: Not on file  . Number of children: Not on file  . Years of education: Not on file  . Highest education level: Not on file  Occupational History  . Not on file  Social Needs  . Financial resource strain: Not on file  . Food insecurity:    Worry: Not on file    Inability: Not on file  . Transportation needs:    Medical: Not on file    Non-medical: Not on file  Tobacco Use  . Smoking status: Never Smoker  . Smokeless tobacco: Never Used  Substance and Sexual Activity    . Alcohol use: No  . Drug use: No  . Sexual activity: Yes  Lifestyle  . Physical activity:    Days per week: Not on file    Minutes per session: Not on file  . Stress: Not on file  Relationships  . Social connections:    Talks on phone: Not on file    Gets together: Not on file    Attends religious service: Not on file    Active member of club or organization: Not on file    Attends meetings of clubs or organizations: Not on file    Relationship status: Not on file  . Intimate partner violence:    Fear of current or ex partner: Not on file    Emotionally abused: Not on file    Physically abused: Not on file    Forced sexual activity: Not on file  Other Topics Concern  . Not on file  Social History Narrative  . Not on file   Family History  Problem Relation Age of Onset  . Diabetes Mother   . Hypertension Mother   . Stroke Mother   . Heart disease Mother   . Heart disease Father   . Stroke Father   . Depression Father   . Alzheimer's disease Father   . Diabetes Sister   . Diabetes Brother   . Hyperlipidemia Brother     Objective: Office vital signs reviewed. BP (!) 89/54   Pulse 66   Temp (!) 97.4 F (36.3 C) (Oral)   LMP 08/24/2016   SpO2 100%   Physical Examination:  General: Awake, alert, thin, No acute distress HEENT: Normal, sclera white, MMM Cardio: bradycardic w/ regular rhythm, S1S2 heard, no murmurs appreciated Pulm: inspiratory and expiratory wheezes present.  Good airmovement. Normal work of breathing on room air. No results found.  Assessment/ Plan: 53 y.o. female  1. Dyspnea on exertion Chest x-ray ordered to further evaluate.  I suspect that this is related to possible progression of underlying lung disease and neuromuscular disease possibly affecting the diaphragm.  Her pulse ox on room air was within normal limits today.  Have advised her to follow-up with pulmonology as scheduled and perhaps reach out to them since she has yet to receive  the equipment that they were recommending.  I will contact her with the results of chest x-ray once formally reviewed by radiology - DG Chest 2 View; Future  2. Neuromuscular disorder ([place]) Possibly contributing to left hip pain.  We discussed to mention this at her next visit.  If needed, I can place an order to orthopedics for further evaluation and possible injection of the hip.  3. Nonintractable epilepsy without status epilepticus, unspecified epilepsy type ([place]) Continues to have what sounds like absence seizures intermittently.  She has follow-up with neurology soon  4. Left hip pain Continue medication prescribed by specialist.  Will place referral to orthopedist if needed.   Orders Placed This Encounter  Procedures  . DG Chest 2 View    Standing Status:   Future    Number of Occurrences:   1    Standing Expiration Date:   11/20/2019    Order Specific Question:   Reason for Exam (SYMPTOM  OR DIAGNOSIS REQUIRED)    Answer:   sob    Order Specific Question:   Is the patient pregnant?    Answer:   No    Order Specific Question:   Preferred imaging location?    Answer:   Internal   No orders of the defined types were placed in this encounter.    Janora Norlander, DO [place] [phone]

## 2018-09-20 NOTE — Patient Instructions (Signed)
Make sure to call the lung specialist about your equipment.  I will call you with the results of your chest xray  Do not forget to mention your hip to your neuromuscular specialist.  If they prefer you see an orthopedist, I can refer you.  Keep me posted.

## 2018-10-27 ENCOUNTER — Other Ambulatory Visit: Payer: Self-pay | Admitting: Family Medicine

## 2018-10-27 MED ORDER — ATORVASTATIN CALCIUM 40 MG PO TABS: 40.0000 mg | ORAL_TABLET | Freq: Every day | ORAL | 0 refills | Status: DC

## 2018-10-27 NOTE — Telephone Encounter (Signed)
Pt aware 30 days given, appt made. Last lipid done 2018

## 2018-10-27 NOTE — Telephone Encounter (Signed)
What is the name of the medication? Atorvastatin 40 mg Patient has been out since March 3rd  Have you contacted your pharmacy to request a refill? YES  Which pharmacy would you like this sent to? [facility] in [place]   Patient notified that their request is being sent to the clinical staff for review and that they should receive a call once it is complete. If they do not receive a call within 24 hours they can check with their pharmacy or our office.

## 2018-11-06 ENCOUNTER — Other Ambulatory Visit: Payer: Self-pay | Admitting: Family Medicine

## 2018-11-22 ENCOUNTER — Ambulatory Visit (INDEPENDENT_AMBULATORY_CARE_PROVIDER_SITE_OTHER): Payer: Managed Care, Other (non HMO) | Admitting: Family Medicine

## 2018-11-22 ENCOUNTER — Other Ambulatory Visit: Payer: Self-pay

## 2018-11-22 DIAGNOSIS — G709 Myoneural disorder, unspecified: Secondary | ICD-10-CM

## 2018-11-22 DIAGNOSIS — I10 Essential (primary) hypertension: Secondary | ICD-10-CM | POA: Diagnosis not present

## 2018-11-22 DIAGNOSIS — E782 Mixed hyperlipidemia: Secondary | ICD-10-CM

## 2018-11-22 MED ORDER — BACLOFEN 20 MG PO TABS: ORAL_TABLET | ORAL | 1 refills | Status: DC

## 2018-11-22 MED ORDER — ATENOLOL 25 MG PO TABS: 25.0000 mg | ORAL_TABLET | Freq: Every day | ORAL | 2 refills | Status: DC

## 2018-11-22 MED ORDER — ATORVASTATIN CALCIUM 40 MG PO TABS: 40.0000 mg | ORAL_TABLET | Freq: Every day | ORAL | 3 refills | Status: DC

## 2018-11-22 MED ORDER — LABETALOL HCL 100 MG PO TABS: ORAL_TABLET | ORAL | 1 refills | Status: AC

## 2018-11-22 NOTE — Progress Notes (Signed)
Telephone visit  Subjective: CC:  HLD/HTN PCP: Janora Norlander, DO JQB:[id number] Deborah Meyer is Deborah 53 y.o. female calls for telephone consult today. Patient provides verbal consent for consult held via phone.  Location of patient: home Location of provider: [facility] Others present for call: grandchildren  1. HLD/HTN Patient reports compliance with antihypertensives and antilipid medications.  She has been out of her own supply of Lipitor since March but has been using her husband's medicine who is on the same dose.  She is due for fasting lipid panel but notes that she is worried about coming into the office due to the COVID-19 outbreak.  She has not had any chest pain, breathing abnormalities outside of her baseline.  2.  Neuromuscular disorder Patient reports intermittent use of the baclofen as directed.  She finds this medication helpful.  She needs refills.   ROS: Per HPI  Allergies  Allergen Reactions  . Bee Venom Anaphylaxis  . Ciprofloxacin Hcl Hives, Itching and Swelling  . Contrast Media [Iodinated Diagnostic Agents] Shortness Of Breath    Pt reports nausea, SOB, "passing out" during IV contrast administration in the past. Pt reports nausea, SOB, "passing out" during IV contrast administration in the past.  . Lorazepam Other (See Comments) and Shortness Of Breath  . Methylprednisolone Sodium Succ Shortness Of Breath  . Morphine And Related Shortness Of Breath and Other (See Comments)    "Blood pressure bottoms out"   . Mushroom Extract Complex Other (See Comments)    "Stopped breathing"  . Topiramate Er Hypertension and Shortness Of Breath  . Ciprofloxacin Hives  . Lac Bovis Rash  . Protonix [Pantoprazole Sodium] Nausea And Vomiting    Raises blood pressure  . Dairy Aid [Lactase] Nausea And Vomiting  . Influenza Vaccines   . Lyrica [Pregabalin] Nausea And Vomiting    Caused extreme high blood pressure and sycotic episodes.    . Toradol [Ketorolac Tromethamine] Nausea  And Vomiting and Other (See Comments)    "passed out"  . Xanax Xr [Alprazolam Er] Hypertension    hallucinations  . Asa [Aspirin] Nausea And Vomiting and Rash  . Penicillins Rash  . Prednisone Rash  . Tramadol Rash   Past Medical History:  Diagnosis Date  . Asthma   . COPD (chronic obstructive pulmonary disease) ([place])   . Dyspnea on exertion   . Epilepsy with partial complex seizures ([place])   . Heart attack ([place])   . Heart murmur   . Hypertension   . Low iron    hx of iron supplementation  . Migraine   . Neuromuscular disorder ([place])    frequent falls  . Neuromuscular disorder ([place])   . Normal cardiac stress test 02/2014   low risk stress echo  . Osteoma    craniotomy 2006  . 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 Sleep apnea   . Stroke ([place])   . Thiamin deficiency   . Vitamin D deficiency     Current Outpatient Medications:  .  albuterol (PROVENTIL HFA;VENTOLIN HFA) 108 (90 BASE) MCG/ACT inhaler, Inhale 2 puffs into the lungs every 6 (six) hours as needed for wheezing., Disp: , Rfl:  .  Armodafinil (NUVIGIL) 250 MG tablet, Take 250 mg by mouth daily., Disp: , Rfl:  .  atenolol (TENORMIN) 25 MG tablet, TAKE 1 TABLET BY MOUTH EVERY DAY, Disp: 90 tablet, Rfl: 0 .  atorvastatin (LIPITOR) 40 MG tablet, Take 1 tablet (40 mg total) by mouth daily., Disp: 30 tablet, Rfl: 0 .  baclofen (LIORESAL) 20 MG tablet, TAKE 1 TABLET BY MOUTH 2 TIMES Deborah DAY AS NEEDED FOR MUSCLE SPASMS, Disp: 60 tablet, Rfl: 2 .  clonazePAM (KLONOPIN) 0.5 MG tablet, Take 0.5 mg by mouth at bedtime., Disp: , Rfl:  .  Coenzyme Q10 50 MG CAPS, Take 1 capsule by mouth daily. , Disp: , Rfl:  .  cyanocobalamin (,VITAMIN B-12,) 1000 MCG/ML injection, Inject 1,000 mcg into the muscle once Deborah week. On Sunday., Disp: , Rfl:  .  diazepam (VALIUM) 5 MG tablet, Take 1 tablet by mouth every 6 (six) hours as needed for anxiety. , Disp: , Rfl: 0 .  donepezil (ARICEPT) 10 MG tablet, Take 1 tablet by mouth daily. ,  Disp: , Rfl:  .  doxycycline (VIBRA-TABS) 100 MG tablet, Take 1 tablet (100 mg total) by mouth 2 (two) times daily., Disp: 20 tablet, Rfl: 0 .  EMGALITY 120 MG/ML SOAJ, , Disp: , Rfl:  .  EPINEPHrine 0.3 mg/0.3 mL IJ SOAJ injection, inject 0.3 ML into THE muscle ONCE as needed FOR anaphylaxis, Disp: , Rfl: 0 .  fluticasone-salmeterol (ADVAIR HFA) 230-21 MCG/ACT inhaler, Inhale 2 puffs into the lungs 2 (two) times daily., Disp: 1 Inhaler, Rfl: 12 .  HYDROcodone-acetaminophen (NORCO/VICODIN) 5-325 MG per tablet, Take 1 tablet by mouth every 6 (six) hours as needed for pain., Disp: , Rfl:  .  ibandronate (BONIVA) 150 MG tablet, Take 1 tablet by mouth every 30 (thirty) days., Disp: , Rfl:  .  isosorbide dinitrate (ISORDIL) 10 MG tablet, isosorbide dinitrate 10 mg tablet  take 1  Tablet by mouth 3 times daily, Disp: , Rfl:  .  labetalol (NORMODYNE) 100 MG tablet, TAKE ONE-HALF TABLET BY MOUTH EVERY 12 HOURS, Disp: 90 tablet, Rfl: 0 .  lamoTRIgine (LAMICTAL) 200 MG tablet, Take 200 mg by mouth 2 (two) times daily., Disp: , Rfl: 6 .  MethylPREDNISolone Acetate (DEPO-MEDROL IJ), Inject 10 mg as directed every 3 (three) days., Disp: , Rfl:  .  montelukast (SINGULAIR) 10 MG tablet, Take 10 mg by mouth at bedtime., Disp: , Rfl:  .  naproxen (NAPROSYN) 500 MG tablet, Take 1 tablet by mouth 2 (two) times daily as needed for mild pain. , Disp: , Rfl:  .  nitroGLYCERIN (NITROSTAT) 0.4 MG SL tablet, Place 1 tablet (0.4 mg total) under the tongue every 5 (five) minutes., Disp: 30 tablet, Rfl: 1 .  Omega-3 Fatty Acids (FISH OIL) 1000 MG CAPS, Take 1,000 mg by mouth daily. , Disp: , Rfl:  .  ondansetron (ZOFRAN) 8 MG tablet, Take 8 mg by mouth every 8 (eight) hours as needed. for nausea, Disp: , Rfl: 0 .  oseltamivir (TAMIFLU) 75 MG capsule, Take 1 capsule (75 mg total) by mouth 2 (two) times daily., Disp: 10 capsule, Rfl: 0 .  ticagrelor (BRILINTA) 90 MG TABS tablet, Take 1 tablet (90 mg total) by mouth 2 (two) times  daily., Disp: 180 tablet, Rfl: 0 .  Tiotropium Bromide Monohydrate (SPIRIVA RESPIMAT IN), Spiriva Respimat 1.25 mcg/actuation solution for inhalation, Disp: , Rfl:  .  traZODone (DESYREL) 100 MG tablet, Take 100 mg by mouth at bedtime., Disp: , Rfl: 6 .  trimethoprim-polymyxin b (POLYTRIM) ophthalmic solution, Place 1 drop into both eyes every 4 (four) hours., Disp: 10 mL, Rfl: 0 .  TROKENDI XR 100 MG CP24, Take 1 capsule by mouth daily., Disp: , Rfl: 6 .  vitamin E 400 UNIT capsule, Take 400 Units by mouth daily., Disp: , Rfl:   Assessment/  Plan: 53 y.o. female   1. Essential hypertension Controlled.  Refill atenolol and labetalol - labetalol (NORMODYNE) 100 MG tablet; TAKE ONE-HALF TABLET BY MOUTH EVERY 12 HOURS  Dispense: 90 tablet; Refill: 1 - atenolol (TENORMIN) 25 MG tablet; Take 1 tablet (25 mg total) by mouth daily.  Dispense: 90 tablet; Refill: 2  2. Neuromuscular disorder ([place]) Stable.  Refill baclofen - baclofen (LIORESAL) 20 MG tablet; TAKE 1 TABLET BY MOUTH 2 TIMES Deborah DAY AS NEEDED FOR MUSCLE SPASMS  Dispense: 180 tablet; Refill: 1  3. Mixed hyperlipidemia Plan for fasting lipid panel at her next visit.  Lipitor refilled. - atorvastatin (LIPITOR) 40 MG tablet; Take 1 tablet (40 mg total) by mouth daily.  Dispense: 90 tablet; Refill: 3   Start time: 9:35am End time: 9:42am  Total time spent on patient care (including telephone call/ virtual visit): 15 minutes  [place], [place] [phone]

## 2018-12-21 ENCOUNTER — Ambulatory Visit: Payer: Managed Care, Other (non HMO) | Admitting: Family Medicine

## 2018-12-27 ENCOUNTER — Other Ambulatory Visit: Payer: Self-pay

## 2018-12-28 ENCOUNTER — Encounter: Payer: Self-pay | Admitting: Family Medicine

## 2018-12-28 ENCOUNTER — Ambulatory Visit (INDEPENDENT_AMBULATORY_CARE_PROVIDER_SITE_OTHER): Payer: Managed Care, Other (non HMO) | Admitting: Family Medicine

## 2018-12-28 VITALS — BP 104/65 | HR 65 | Temp 97.0°F | Ht 67.0 in

## 2018-12-28 DIAGNOSIS — I10 Essential (primary) hypertension: Secondary | ICD-10-CM | POA: Diagnosis not present

## 2018-12-28 DIAGNOSIS — E782 Mixed hyperlipidemia: Secondary | ICD-10-CM

## 2018-12-28 DIAGNOSIS — L989 Disorder of the skin and subcutaneous tissue, unspecified: Secondary | ICD-10-CM

## 2018-12-28 DIAGNOSIS — G40909 Epilepsy, unspecified, not intractable, without status epilepticus: Secondary | ICD-10-CM | POA: Diagnosis not present

## 2018-12-28 DIAGNOSIS — G709 Myoneural disorder, unspecified: Secondary | ICD-10-CM | POA: Diagnosis not present

## 2018-12-28 DIAGNOSIS — D649 Anemia, unspecified: Secondary | ICD-10-CM

## 2018-12-28 LAB — LIPID PANEL

## 2018-12-28 NOTE — Progress Notes (Signed)
Subjective: CC: Follow-up hypertension, hyperglycemia, seizure disorder, neuromuscular disorder PCP: Janora Norlander, DO YTK:[id number] Deborah Meyer is Deborah 53 y.o. female presenting to clinic today for:  1.  Hypertension/neuromuscular disorder Patient with hypertension treated with atenolol 25 mg daily.  Of note, Deborah Meyer also has history of adrenal insufficiency.  Deborah Meyer is followed by endocrinology for this.  No chest pain.  Deborah Meyer does have occasional shortness of breath and wheezing.  Deborah Meyer takes an inhaled corticosteroid and PRN albuterol.  With regards to her neuromuscular disorder, Deborah Meyer has plans for possible Botox injections in the right lower extremity.  Deborah Meyer is awaiting insurance approval for this.  Next follow-up with neurology is in July.  Deborah Meyer worries that her breathing may be related to progression of neuromuscular disorder.  Deborah Meyer is scheduled to see Deborah new gastroenterologist at [facility] for "pacemaker of the stomach".  This would not be until the fall but Deborah Meyer is looking forward to this as Deborah Meyer has quite Deborah bit of trouble digesting her food secondary to gastroparesis.  This limits her oral intake.  2.  Seizure disorder No grand mal seizures in quite some time.  Deborah Meyer is compliant with her antiepileptic medication.   ROS: Per HPI  Allergies  Allergen Reactions   Bee Venom Anaphylaxis   Ciprofloxacin Hcl Hives, Itching and Swelling   Contrast Media [Iodinated Diagnostic Agents] Shortness Of Breath    Pt reports nausea, SOB, "passing out" during IV contrast administration in the past. Pt reports nausea, SOB, "passing out" during IV contrast administration in the past.   Lorazepam Other (See Comments) and Shortness Of Breath   Methylprednisolone Sodium Succ Shortness Of Breath   Morphine And Related Shortness Of Breath and Other (See Comments)    "Blood pressure bottoms out"    Mushroom Extract Complex Other (See Comments)    "Stopped breathing"   Topiramate Er Hypertension and Shortness Of  Breath   Ciprofloxacin Hives   Lac Bovis Rash   Protonix [Pantoprazole Sodium] Nausea And Vomiting    Raises blood pressure   Dairy Aid [Lactase] Nausea And Vomiting   Influenza Vaccines    Lyrica [Pregabalin] Nausea And Vomiting    Caused extreme high blood pressure and sycotic episodes.     Toradol [Ketorolac Tromethamine] Nausea And Vomiting and Other (See Comments)    "passed out"   Xanax Xr [Alprazolam Er] Hypertension    hallucinations   Asa [Aspirin] Nausea And Vomiting and Rash   Penicillins Rash   Prednisone Rash   Tramadol Rash   Past Medical History:  Diagnosis Date   Asthma    COPD (chronic obstructive pulmonary disease) (HCC)    Dyspnea on exertion    Epilepsy with partial complex seizures ([place])    Heart attack ([place])    Heart murmur    Hypertension    Low iron    hx of iron supplementation   Migraine    Neuromuscular disorder ([place])    frequent falls   Neuromuscular disorder (HCC)    Normal cardiac stress test 02/2014   low risk stress echo   Osteoma    craniotomy 2006   Progressive multifocal leukoencephalopathy (HCC)    Sleep apnea    Stroke ([place])    Thiamin deficiency    Vitamin D deficiency     Current Outpatient Medications:    albuterol (PROVENTIL HFA;VENTOLIN HFA) 108 (90 BASE) MCG/ACT inhaler, Inhale 2 puffs into the lungs every 6 (six) hours as needed for wheezing., Disp: , Rfl:  Armodafinil (NUVIGIL) 250 MG tablet, Take 250 mg by mouth daily., Disp: , Rfl:    atenolol (TENORMIN) 25 MG tablet, Take 1 tablet (25 mg total) by mouth daily., Disp: 90 tablet, Rfl: 2   atorvastatin (LIPITOR) 40 MG tablet, Take 1 tablet (40 mg total) by mouth daily., Disp: 90 tablet, Rfl: 3   baclofen (LIORESAL) 20 MG tablet, TAKE 1 TABLET BY MOUTH 2 TIMES Deborah DAY AS NEEDED FOR MUSCLE SPASMS, Disp: 180 tablet, Rfl: 1   clonazePAM (KLONOPIN) 0.5 MG tablet, Take 0.5 mg by mouth at bedtime., Disp: , Rfl:    Coenzyme Q10 50 MG CAPS,  Take 1 capsule by mouth daily. , Disp: , Rfl:    cyanocobalamin (,VITAMIN B-12,) 1000 MCG/ML injection, Inject 1,000 mcg into the muscle once Deborah week. On Sunday., Disp: , Rfl:    diazepam (VALIUM) 5 MG tablet, Take 1 tablet by mouth every 6 (six) hours as needed for anxiety. , Disp: , Rfl: 0   donepezil (ARICEPT) 10 MG tablet, Take 1 tablet by mouth daily. , Disp: , Rfl:    EMGALITY 120 MG/ML SOAJ, , Disp: , Rfl:    EPINEPHrine 0.3 mg/0.3 mL IJ SOAJ injection, inject 0.3 ML into THE muscle ONCE as needed FOR anaphylaxis, Disp: , Rfl: 0   fluticasone-salmeterol (ADVAIR HFA) 230-21 MCG/ACT inhaler, Inhale 2 puffs into the lungs 2 (two) times daily., Disp: 1 Inhaler, Rfl: 12   HYDROcodone-acetaminophen (NORCO/VICODIN) 5-325 MG per tablet, Take 1 tablet by mouth every 6 (six) hours as needed for pain., Disp: , Rfl:    ibandronate (BONIVA) 150 MG tablet, Take 1 tablet by mouth every 30 (thirty) days., Disp: , Rfl:    isosorbide dinitrate (ISORDIL) 10 MG tablet, isosorbide dinitrate 10 mg tablet  take 1  Tablet by mouth 3 times daily, Disp: , Rfl:    labetalol (NORMODYNE) 100 MG tablet, TAKE ONE-HALF TABLET BY MOUTH EVERY 12 HOURS, Disp: 90 tablet, Rfl: 1   lamoTRIgine (LAMICTAL) 200 MG tablet, Take 200 mg by mouth 2 (two) times daily., Disp: , Rfl: 6   MethylPREDNISolone Acetate (DEPO-MEDROL IJ), Inject 10 mg as directed every 3 (three) days., Disp: , Rfl:    montelukast (SINGULAIR) 10 MG tablet, Take 10 mg by mouth at bedtime., Disp: , Rfl:    naproxen (NAPROSYN) 500 MG tablet, Take 1 tablet by mouth 2 (two) times daily as needed for mild pain. , Disp: , Rfl:    nitroGLYCERIN (NITROSTAT) 0.4 MG SL tablet, Place 1 tablet (0.4 mg total) under the tongue every 5 (five) minutes., Disp: 30 tablet, Rfl: 1   Omega-3 Fatty Acids (FISH OIL) 1000 MG CAPS, Take 1,000 mg by mouth daily. , Disp: , Rfl:    ondansetron (ZOFRAN) 8 MG tablet, Take 8 mg by mouth every 8 (eight) hours as needed. for  nausea, Disp: , Rfl: 0   ticagrelor (BRILINTA) 90 MG TABS tablet, Take 1 tablet (90 mg total) by mouth 2 (two) times daily., Disp: 180 tablet, Rfl: 0   Tiotropium Bromide Monohydrate (SPIRIVA RESPIMAT IN), Spiriva Respimat 1.25 mcg/actuation solution for inhalation, Disp: , Rfl:    traZODone (DESYREL) 100 MG tablet, Take 100 mg by mouth at bedtime., Disp: , Rfl: 6   TROKENDI XR 100 MG CP24, Take 1 capsule by mouth daily., Disp: , Rfl: 6   vitamin E 400 UNIT capsule, Take 400 Units by mouth daily., Disp: , Rfl:  Social History   Socioeconomic History   Marital status: Married  Spouse name: Not on file   Number of children: Not on file   Years of education: Not on file   Highest education level: Not on file  Occupational History   Not on file  Social Needs   Financial resource strain: Not on file   Food insecurity:    Worry: Not on file    Inability: Not on file   Transportation needs:    Medical: Not on file    Non-medical: Not on file  Tobacco Use   Smoking status: Never Smoker   Smokeless tobacco: Never Used  Substance and Sexual Activity   Alcohol use: No   Drug use: No   Sexual activity: Yes  Lifestyle   Physical activity:    Days per week: Not on file    Minutes per session: Not on file   Stress: Not on file  Relationships   Social connections:    Talks on phone: Not on file    Gets together: Not on file    Attends religious service: Not on file    Active member of club or organization: Not on file    Attends meetings of clubs or organizations: Not on file    Relationship status: Not on file   Intimate partner violence:    Fear of current or ex partner: Not on file    Emotionally abused: Not on file    Physically abused: Not on file    Forced sexual activity: Not on file  Other Topics Concern   Not on file  Social History Narrative   Not on file   Family History  Problem Relation Age of Onset   Diabetes Mother    Hypertension  Mother    Stroke Mother    Heart disease Mother    Heart disease Father    Stroke Father    Depression Father    Alzheimer's disease Father    Diabetes Sister    Diabetes Brother    Hyperlipidemia Brother     Objective: Office vital signs reviewed. BP 104/65    Pulse 65    Temp (!) 97 F (36.1 C) (Oral)    Ht '5\' 7"'$  (1.702 m)    LMP 08/24/2016    BMI 16.60 kg/m   Physical Examination:  General: Awake, alert, thin, No acute distress HEENT: Normal, sclera white Cardio: regular rate and rhythm, S1S2 heard, no murmurs appreciated Pulm: clear to auscultation bilaterally, no wheezes, rhonchi or rales; normal work of breathing on room air Extremities: no edema MSK: arrives in wheelchair.  Able to self propel.  RLE in brace Skin: Deborah Meyer has Deborah 3.18m flesh colored papule on the left lateral aspect of the ventral elbow. Lesion is soft and rubbery in texture.  Assessment/ Plan: 53y.o. female   1. [place]romuscular disorder ([place] Continues to be followed by neurology for issue  2. 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  3. Essential hypertension Controlled.  I am considering discontinuing the atenolol.  I will discuss this further with the patient at her next visit as I do not think that Deborah Meyer really needs this medication ongoing. - CMP14+EGFR  4. Mixed hyperlipidemia Check fasting lipid panel - CMP14+EGFR - Lipid Panel - TSH  5. Anemia, unspecified type - CBC  6. Skin lesion of left upper extremity Allergy to novocaine.  Therefore this was not removed here in office.  Will refer to derm for further evaluation - Ambulatory referral to Dermatology  Orders Placed This Encounter  Procedures   CMP14+EGFR   Lipid Panel   CBC   No orders of the defined types were placed in this encounter.    Janora Norlander, DO [place] [phone]

## 2018-12-29 LAB — CMP14+EGFR
ALT: 44 IU/L — ABNORMAL HIGH (ref 0–32)
AST: 47 IU/L — ABNORMAL HIGH (ref 0–40)
Albumin/Globulin Ratio: 2.3 — ABNORMAL HIGH (ref 1.2–2.2)
Albumin: 4.1 g/dL (ref 3.8–4.9)
Alkaline Phosphatase: 101 IU/L (ref 39–117)
BUN/Creatinine Ratio: 17 (ref 9–23)
BUN: 14 mg/dL (ref 6–24)
Bilirubin Total: 0.3 mg/dL (ref 0.0–1.2)
CO2: 21 mmol/L (ref 20–29)
Calcium: 9 mg/dL (ref 8.7–10.2)
Chloride: 108 mmol/L — ABNORMAL HIGH (ref 96–106)
Creatinine, Ser: 0.84 mg/dL (ref 0.57–1.00)
GFR calc Af Amer: 92 mL/min/{1.73_m2} (ref >59)
GFR calc non Af Amer: 80 mL/min/{1.73_m2} (ref >59)
Globulin, Total: 1.8 g/dL (ref 1.5–4.5)
Glucose: 82 mg/dL (ref 65–99)
Potassium: 3.9 mmol/L (ref 3.5–5.2)
Sodium: 141 mmol/L (ref 134–144)
Total Protein: 5.9 g/dL — ABNORMAL LOW (ref 6.0–8.5)

## 2018-12-29 LAB — LIPID PANEL
Chol/HDL Ratio: 1.7 ratio (ref 0.0–4.4)
Cholesterol, Total: 164 mg/dL (ref 100–199)
HDL: 95 mg/dL (ref >39)
LDL Calculated: 56 mg/dL (ref 0–99)
Triglycerides: 64 mg/dL (ref 0–149)
VLDL Cholesterol Cal: 13 mg/dL (ref 5–40)

## 2018-12-29 LAB — CBC
Hematocrit: 33.8 % — ABNORMAL LOW (ref 34.0–46.6)
Hemoglobin: 11.2 g/dL (ref 11.1–15.9)
MCH: 30.4 pg (ref 26.6–33.0)
MCHC: 33.1 g/dL (ref 31.5–35.7)
MCV: 92 fL (ref 79–97)
Platelets: 158 10*3/uL (ref 150–450)
RBC: 3.69 x10E6/uL — ABNORMAL LOW (ref 3.77–5.28)
RDW: 12.4 % (ref 11.7–15.4)
WBC: 4 10*3/uL (ref 3.4–10.8)

## 2018-12-29 LAB — TSH: TSH: 1.23 u[IU]/mL (ref 0.450–4.500)

## 2019-03-31 ENCOUNTER — Ambulatory Visit: Payer: Managed Care, Other (non HMO) | Admitting: Family Medicine

## 2019-04-12 ENCOUNTER — Other Ambulatory Visit: Payer: Self-pay | Admitting: Family Medicine

## 2019-04-12 DIAGNOSIS — G709 Myoneural disorder, unspecified: Secondary | ICD-10-CM

## 2019-06-05 ENCOUNTER — Other Ambulatory Visit: Payer: Self-pay | Admitting: Family Medicine

## 2019-06-05 DIAGNOSIS — I10 Essential (primary) hypertension: Secondary | ICD-10-CM

## 2019-08-23 ENCOUNTER — Other Ambulatory Visit: Payer: Self-pay | Admitting: Family Medicine

## 2019-08-23 DIAGNOSIS — I10 Essential (primary) hypertension: Secondary | ICD-10-CM

## 2019-08-23 MED ORDER — ATENOLOL 25 MG PO TABS: 25.0000 mg | ORAL_TABLET | Freq: Every day | ORAL | 0 refills | Status: AC

## 2019-08-23 NOTE — Telephone Encounter (Signed)
Pt PCP is DR Lajuana Ripple. Seen 12/2018.

## 2019-08-23 NOTE — Telephone Encounter (Signed)
Former [place] by new PCP 30 days given 07/26/19

## 2019-08-23 NOTE — Addendum Note (Signed)
Addended by: Zannie Cove on: 08/23/2019 01:56 PM   Modules accepted: Orders

## 2019-10-13 ENCOUNTER — Other Ambulatory Visit: Payer: Self-pay | Admitting: Family Medicine

## 2019-10-13 DIAGNOSIS — I10 Essential (primary) hypertension: Secondary | ICD-10-CM

## 2019-10-13 NOTE — Telephone Encounter (Signed)
[place]. NTBS 30 days given 08/23/19

## 2019-10-16 NOTE — Telephone Encounter (Signed)
Patient states she does not go here anymore since we do not accept her insurance.

## 2019-10-23 ENCOUNTER — Other Ambulatory Visit: Payer: Self-pay | Admitting: Family Medicine

## 2019-10-23 DIAGNOSIS — G709 Myoneural disorder, unspecified: Secondary | ICD-10-CM

## 2019-12-08 ENCOUNTER — Other Ambulatory Visit: Payer: Self-pay | Admitting: Family Medicine

## 2019-12-08 DIAGNOSIS — E782 Mixed hyperlipidemia: Secondary | ICD-10-CM

## 2020-01-03 ENCOUNTER — Other Ambulatory Visit: Payer: Self-pay | Admitting: *Deleted

## 2020-01-03 DIAGNOSIS — E782 Mixed hyperlipidemia: Secondary | ICD-10-CM

## 2020-01-03 NOTE — Telephone Encounter (Signed)
Left message to please call our office to schedule an appointment to be seen for medication refills.

## 2020-01-03 NOTE — Telephone Encounter (Signed)
[place]. NTBS 30 days given 12/08/19

## 2020-01-05 ENCOUNTER — Other Ambulatory Visit: Payer: Self-pay | Admitting: Family Medicine

## 2020-01-05 DIAGNOSIS — E782 Mixed hyperlipidemia: Secondary | ICD-10-CM

## 2020-01-23 IMAGING — DX DG HIP (WITH OR WITHOUT PELVIS) 2-3V*R*
3 series · 3 of 3 positions shown · non-contrast
Comparison: None.

CLINICAL DATA: Pain following fall

EXAM:
DG HIP (WITH OR WITHOUT PELVIS) 2-3V RIGHT

[pelvis ap]
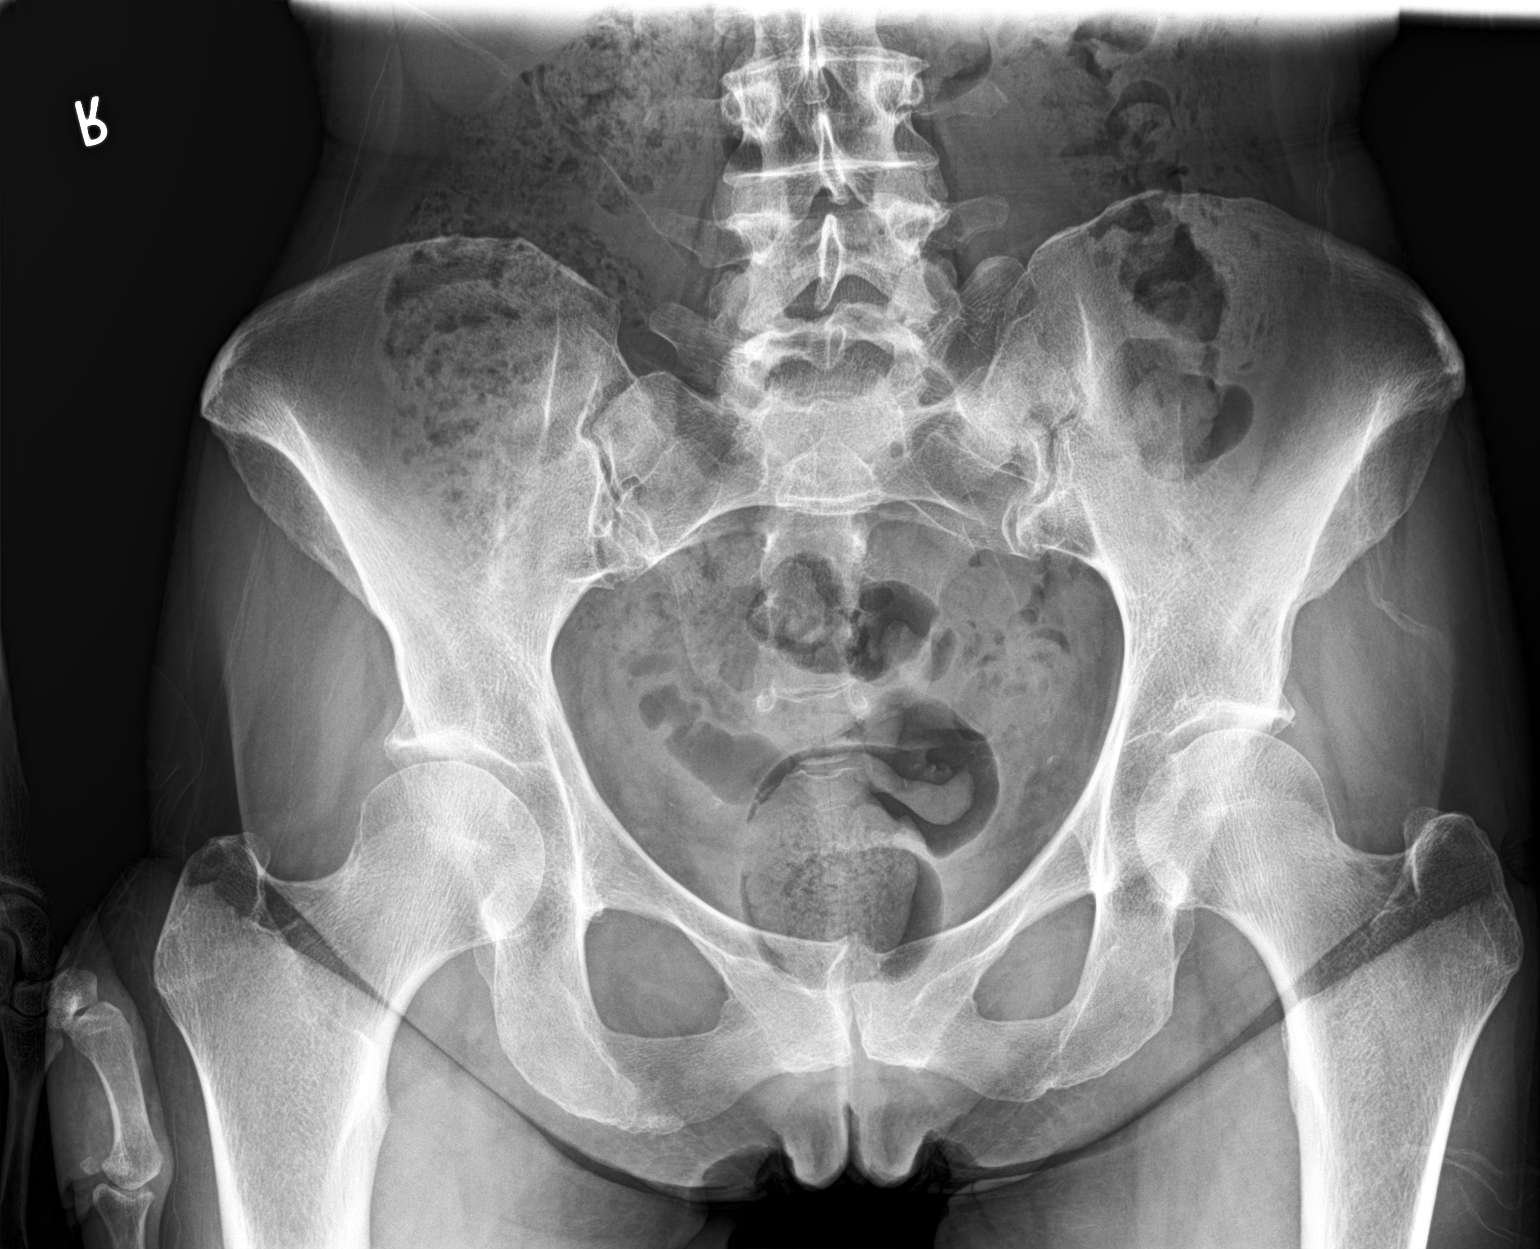

[hip ap]
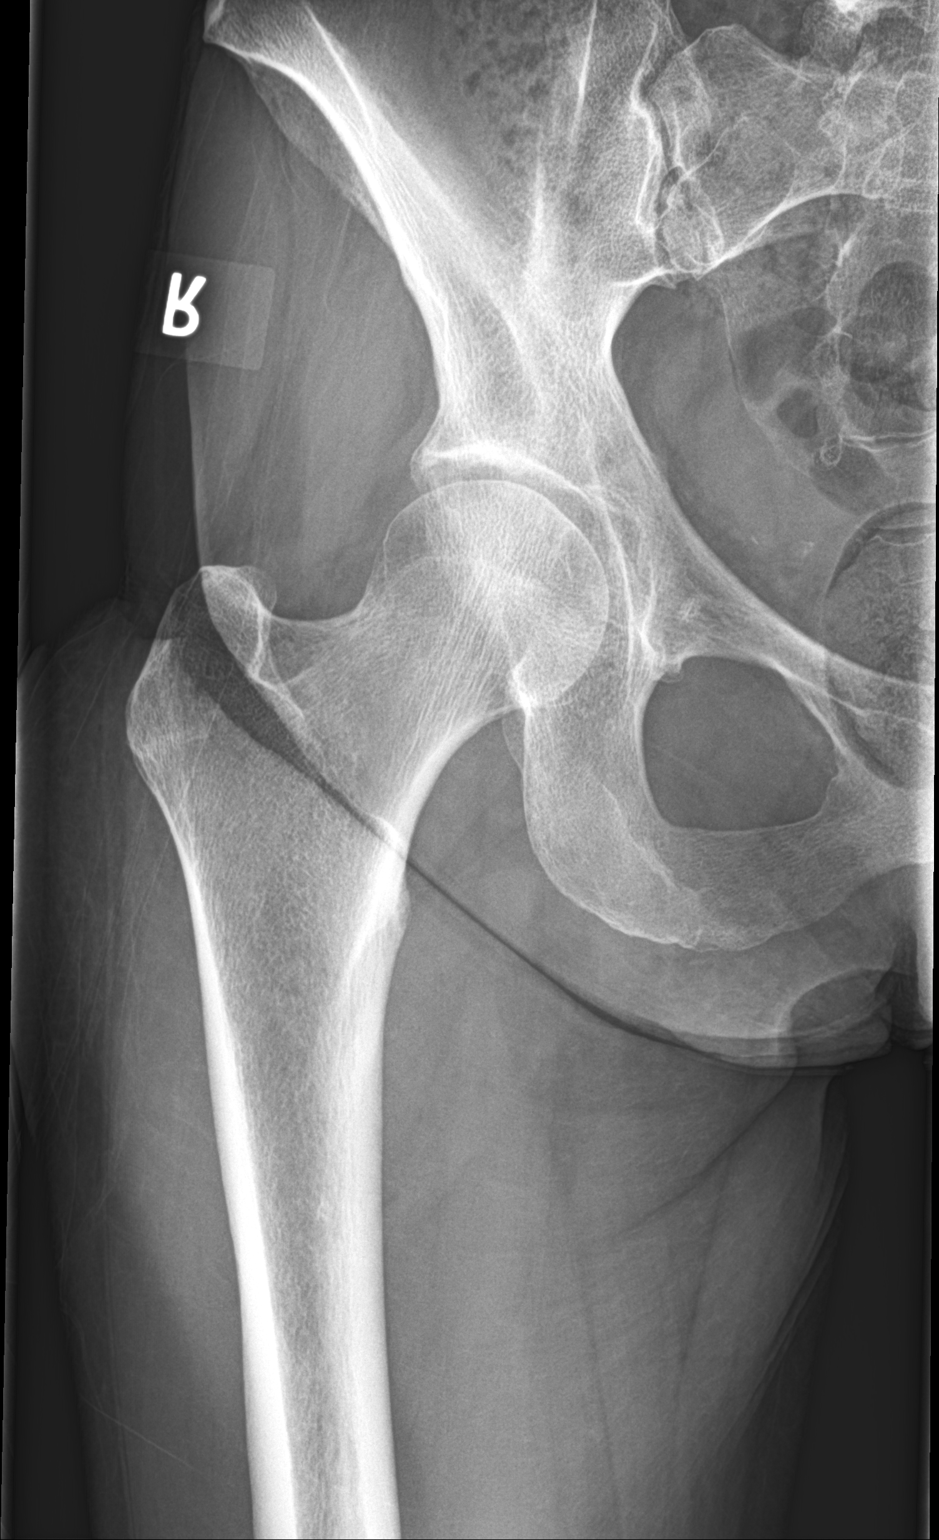

[hip lat]
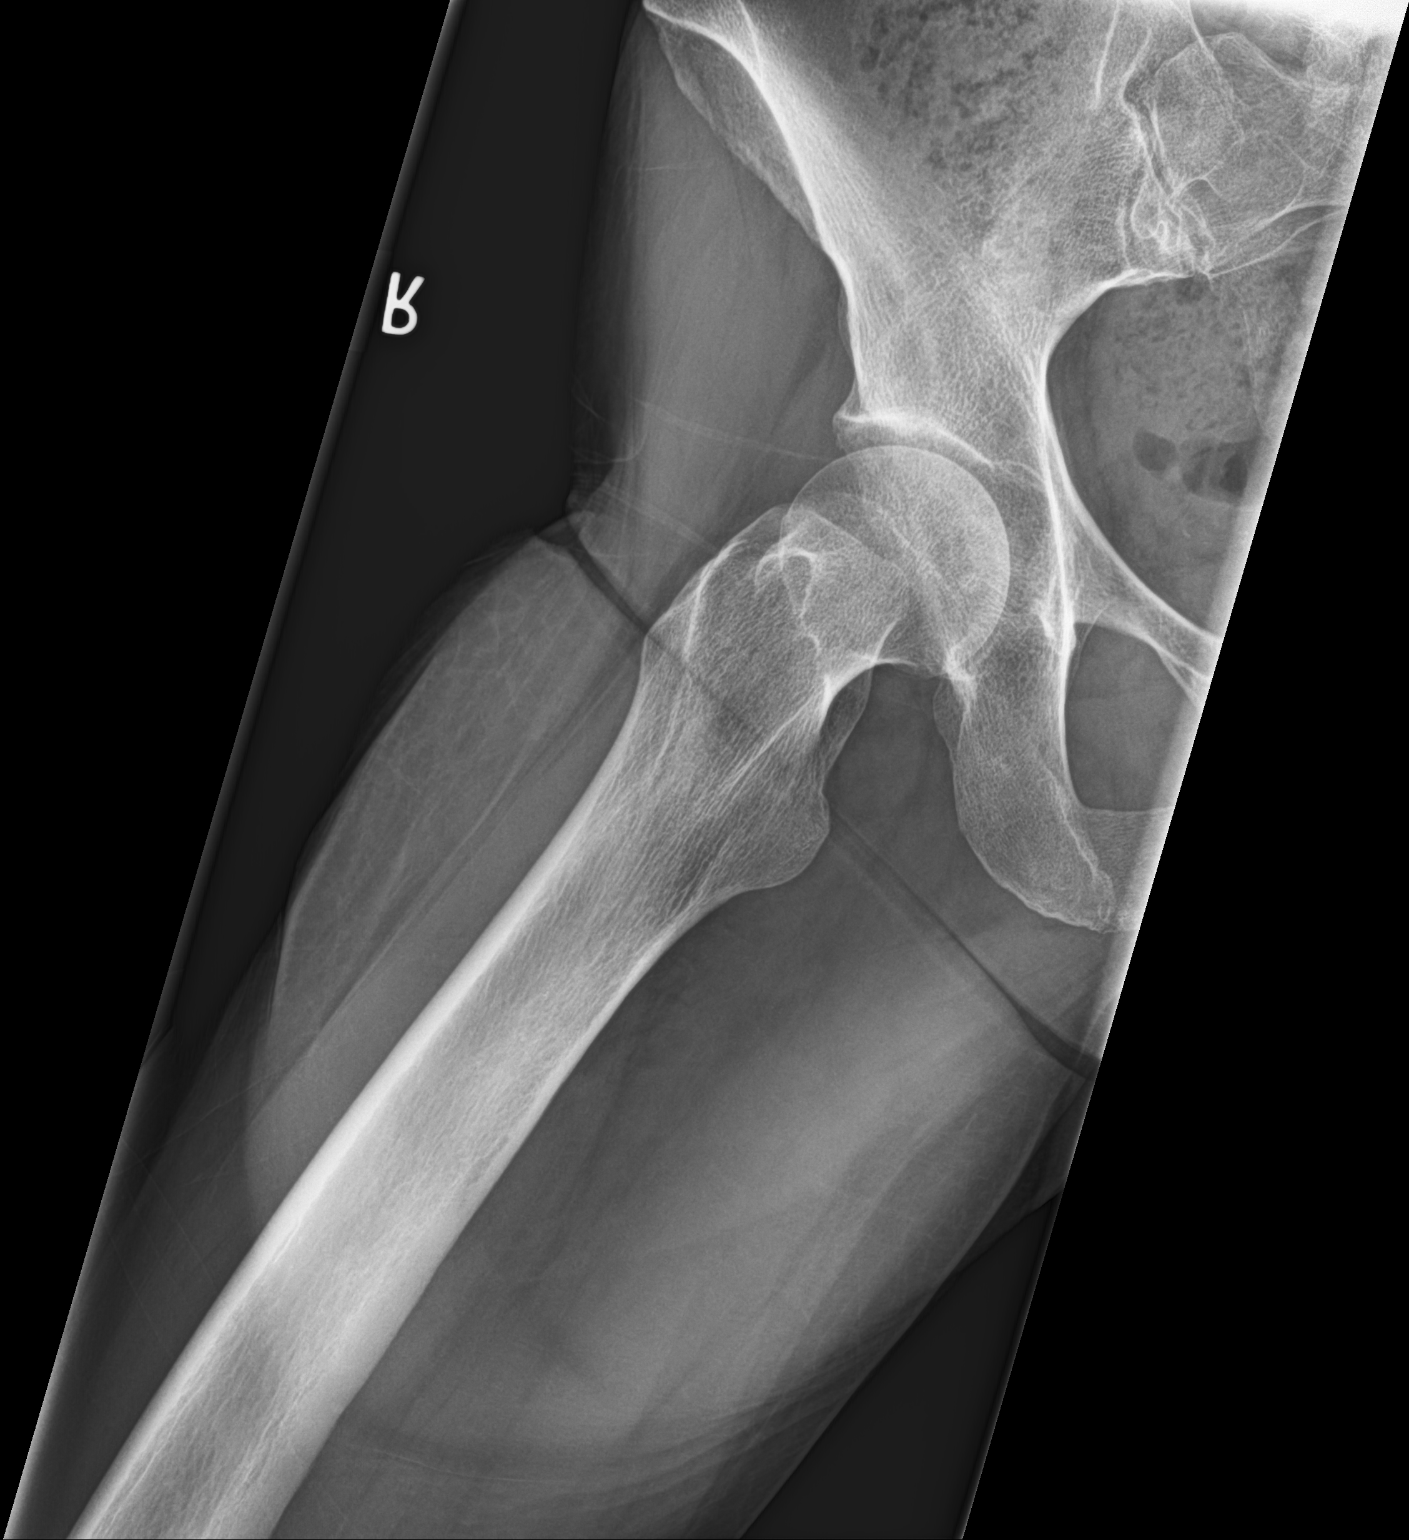

[3 of 3 positions shown; findings below may reference images not displayed]

FINDINGS: Frontal pelvis as well as frontal and lateral right hip images were
obtained. There is no fracture or dislocation. There is no
appreciable joint space narrowing or erosion. There is spurring
along the pubic symphysis superiorly.
IMPRESSION: Mild spurring in the pubic symphysis region. No appreciable joint
space narrowing or erosion. No fracture or dislocation.

## 2020-01-23 IMAGING — DX DG SHOULDER 2+V*R*
3 series · 3 of 3 positions shown · non-contrast
Comparison: None.

CLINICAL DATA: Recent fall

EXAM:
RIGHT SHOULDER - 2+ VIEW

[shoulder ap]
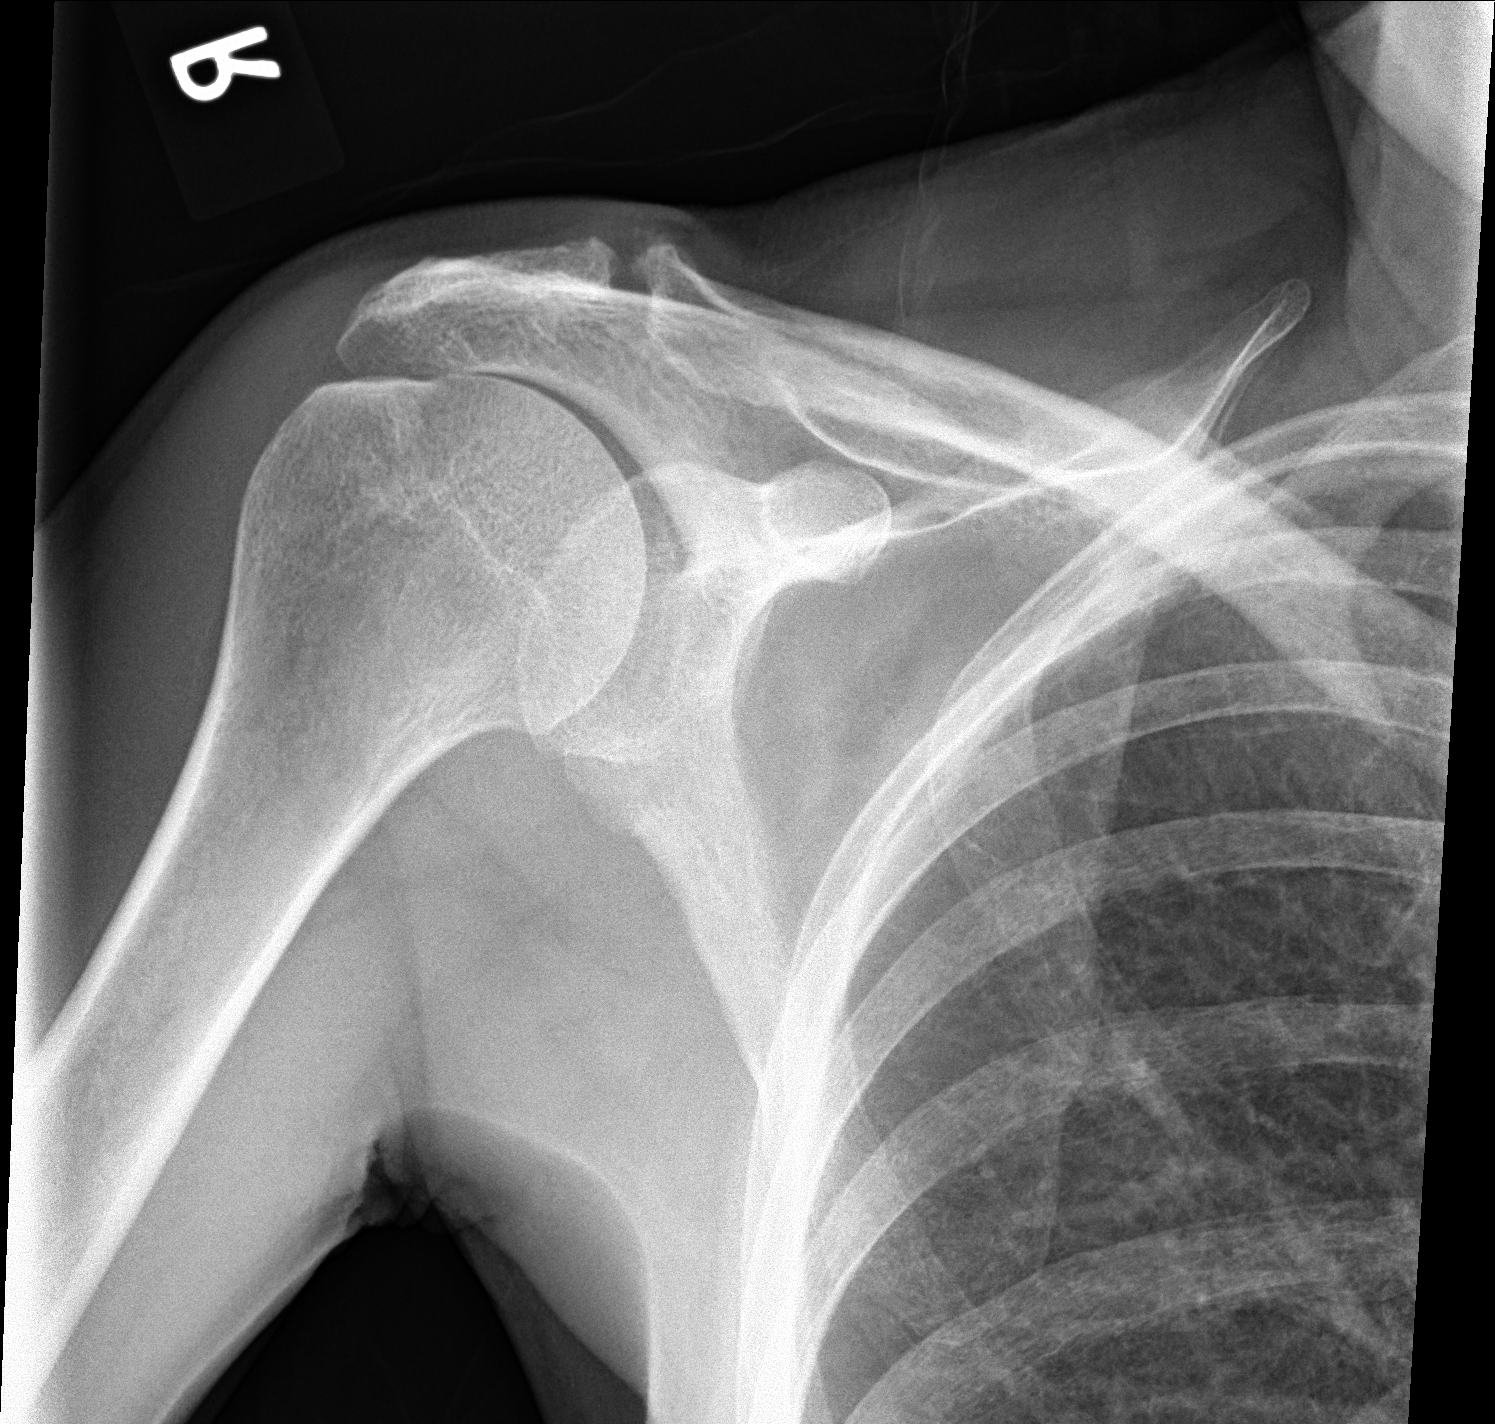

[shoulder obl]
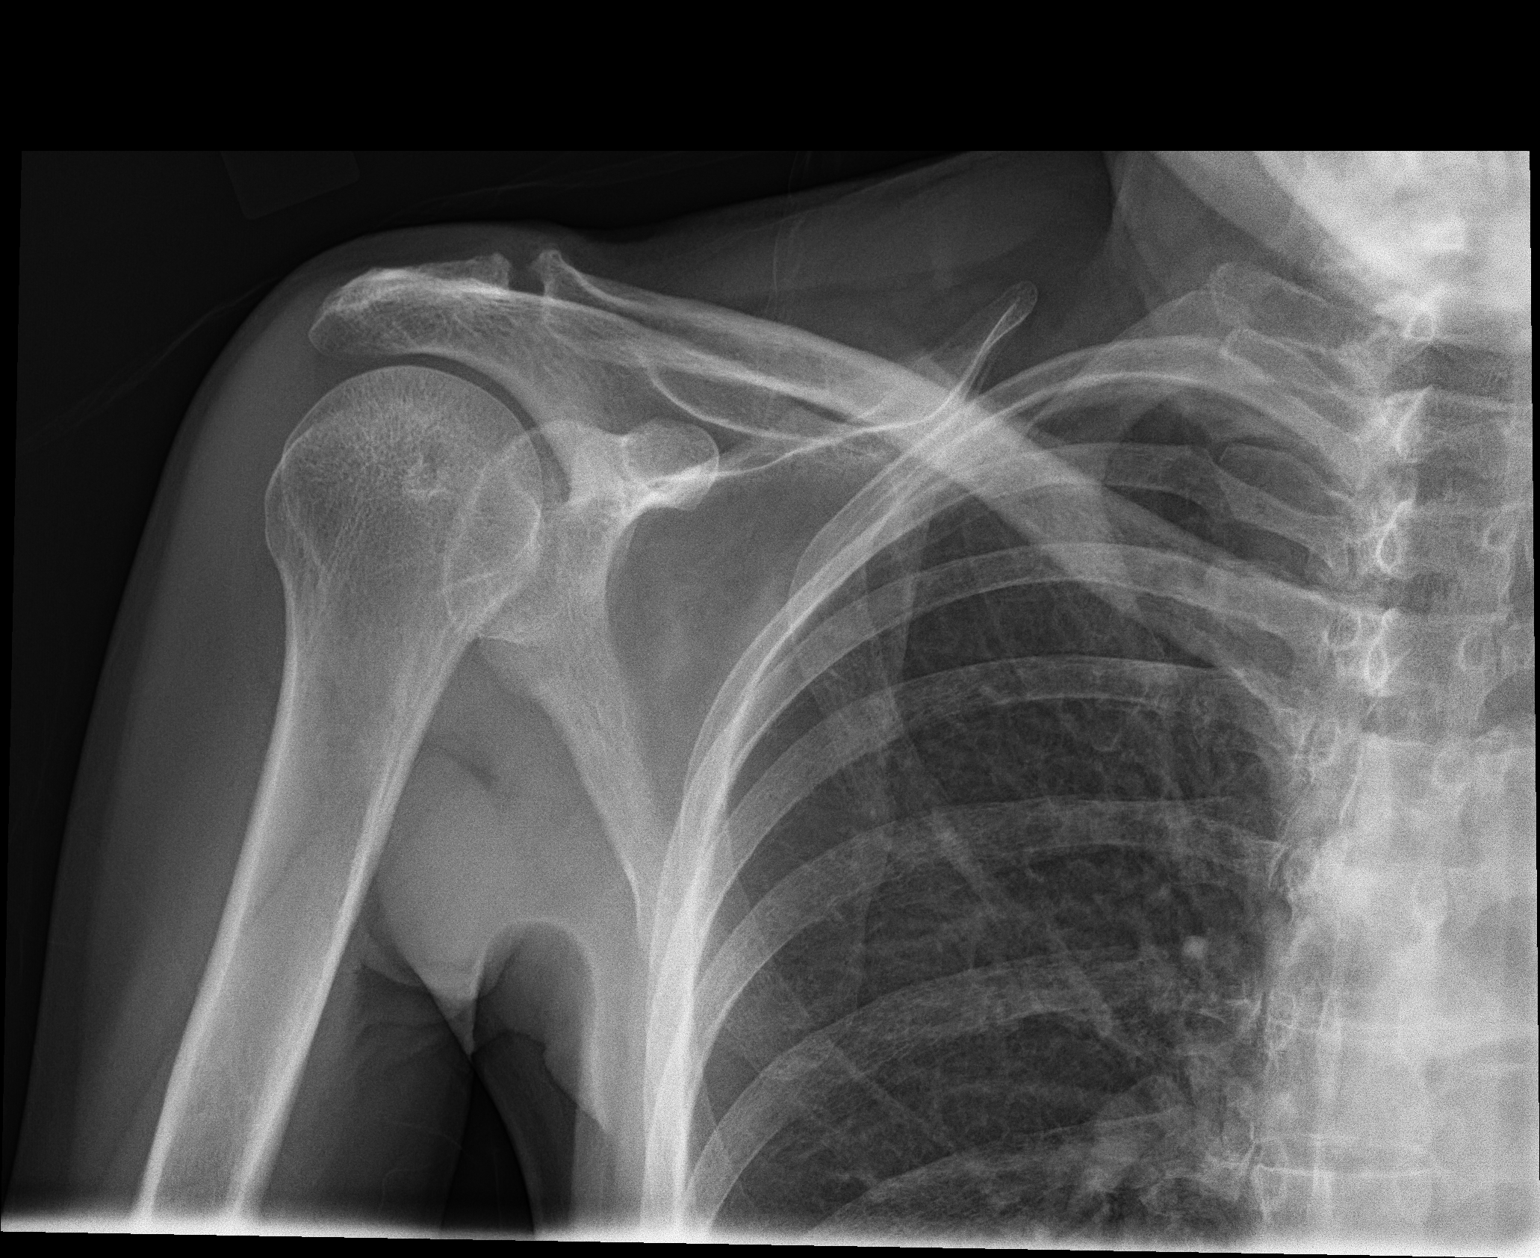

[shoulder axial]
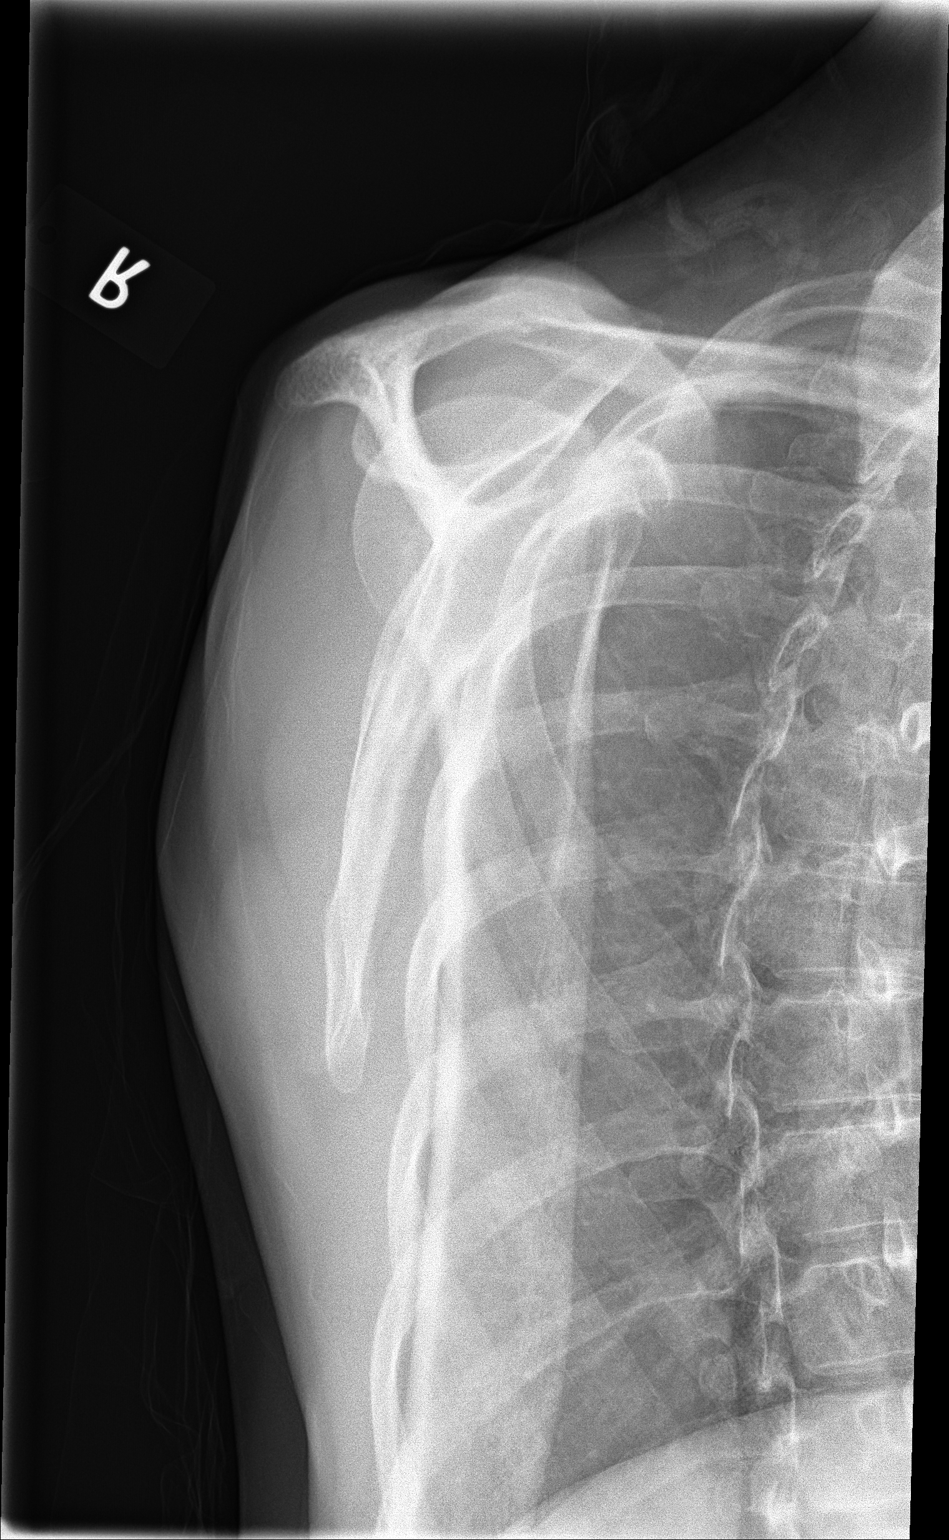

[3 of 3 positions shown; findings below may reference images not displayed]

FINDINGS: Internal rotation, external rotation, and Y scapular images were
obtained. There is no evident fracture or dislocation. There is bony
overgrowth along the superior chromium clavicular joint with small
calcifications in this area, felt to be of arthropathic etiology.
There is mild narrowing of the glenohumeral joint. No erosive
change. Visualized right lung is clear.
IMPRESSION: Areas of osteoarthritic change.  No fracture or dislocation.

## 2020-04-28 ENCOUNTER — Other Ambulatory Visit: Payer: Self-pay | Admitting: Family Medicine

## 2020-04-28 DIAGNOSIS — G709 Myoneural disorder, unspecified: Secondary | ICD-10-CM

## 2020-07-02 ENCOUNTER — Other Ambulatory Visit: Payer: Self-pay | Admitting: Family Medicine

## 2020-07-02 DIAGNOSIS — I10 Essential (primary) hypertension: Secondary | ICD-10-CM

## 2020-08-11 ENCOUNTER — Other Ambulatory Visit: Payer: Self-pay | Admitting: Family Medicine

## 2020-08-11 DIAGNOSIS — G709 Myoneural disorder, unspecified: Secondary | ICD-10-CM

## 2020-08-11 DIAGNOSIS — I10 Essential (primary) hypertension: Secondary | ICD-10-CM

## 2020-09-04 ENCOUNTER — Other Ambulatory Visit: Payer: Self-pay | Admitting: Family Medicine

## 2020-09-04 DIAGNOSIS — G709 Myoneural disorder, unspecified: Secondary | ICD-10-CM

## 2020-09-04 DIAGNOSIS — I10 Essential (primary) hypertension: Secondary | ICD-10-CM

## 2020-09-12 LAB — COLOGUARD: COLOGUARD: NEGATIVE

## 2020-11-24 IMAGING — DX DG CHEST 2V
2 series · 2 of 2 positions shown · non-contrast
Comparison: Chest x-ray 07/06/2017

CLINICAL DATA: Dyspnea on exertion.

EXAM:
CHEST - 2 VIEW

[chest ap]
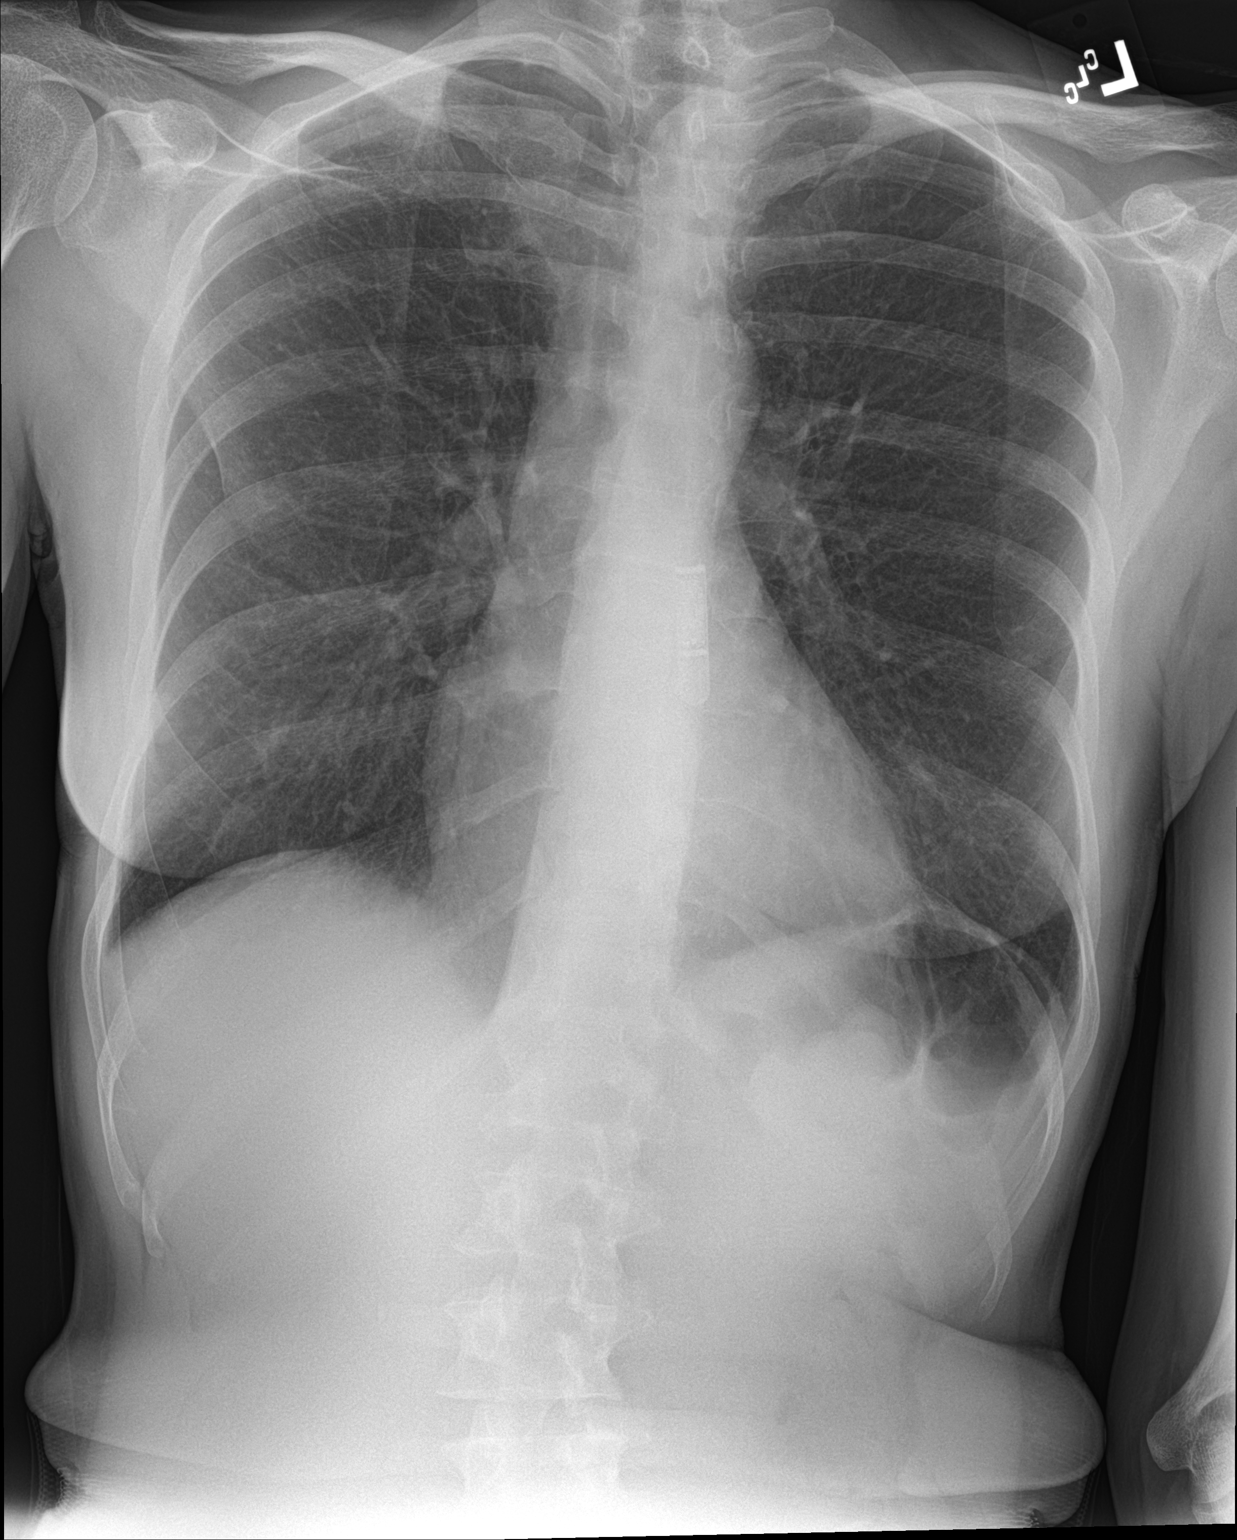

[chest lat]
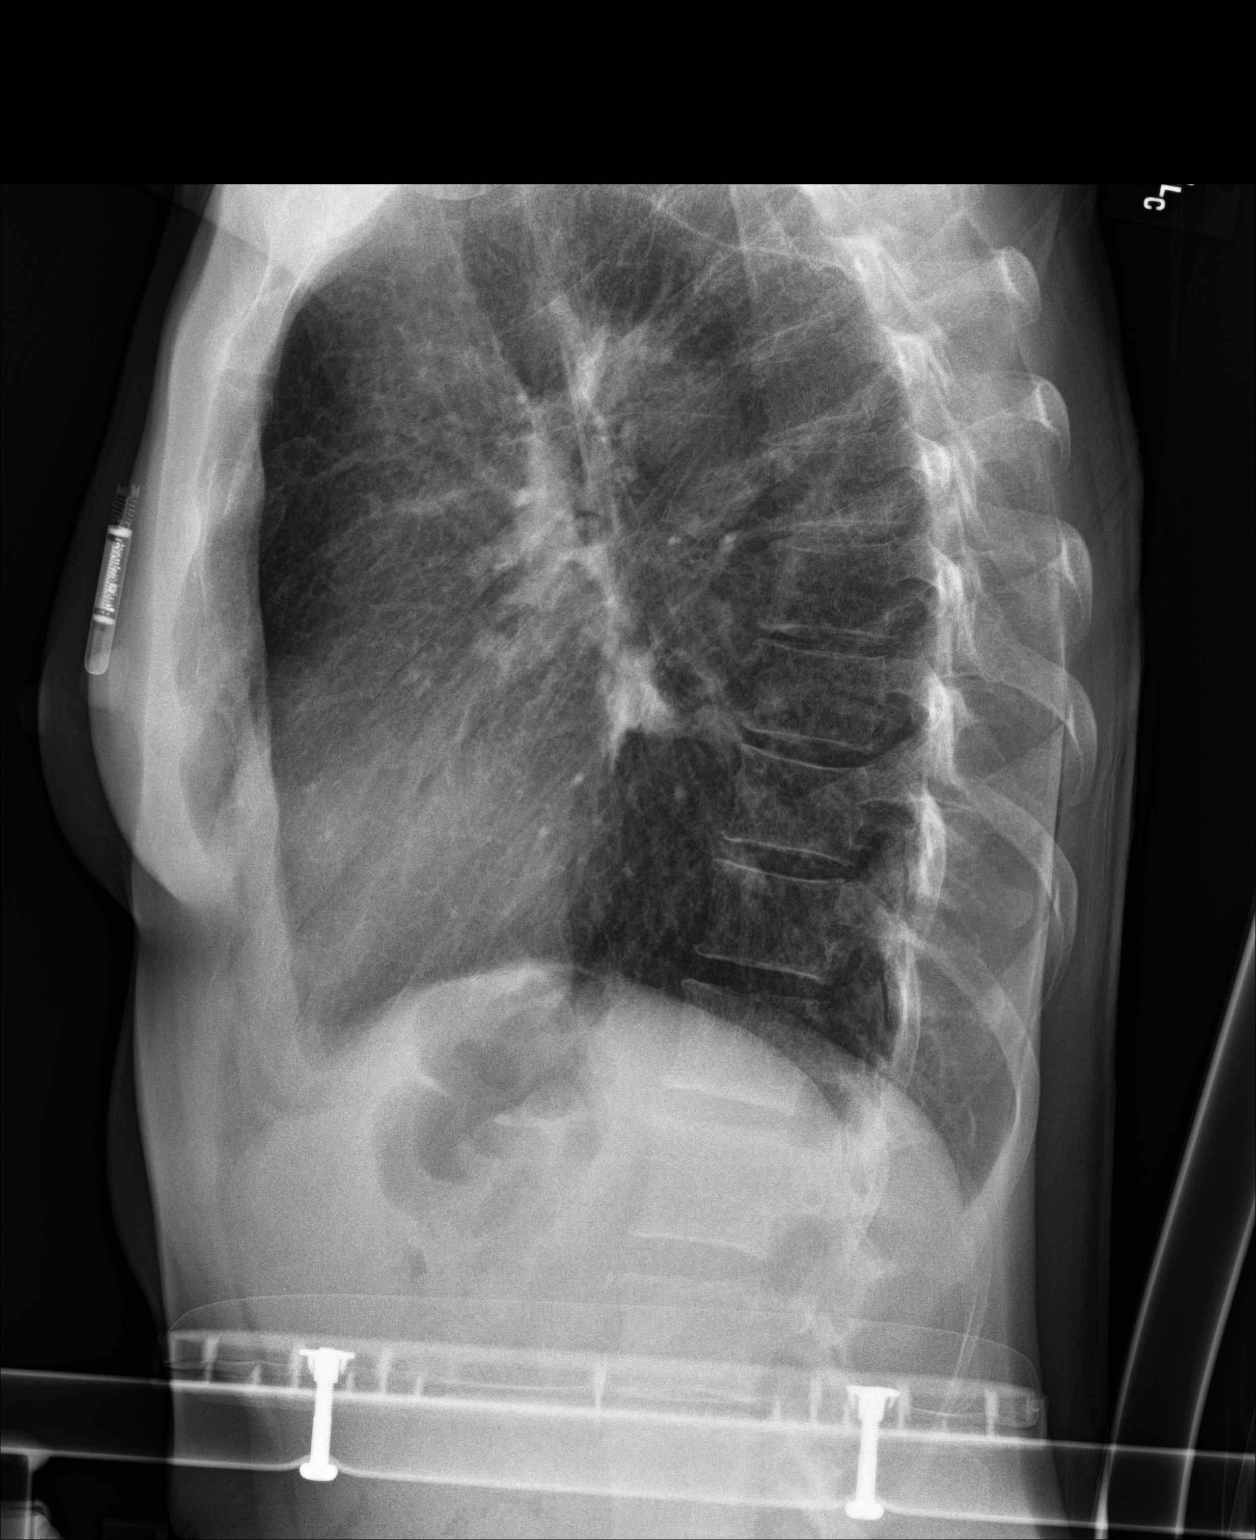

[2 of 2 positions shown; findings below may reference images not displayed]

FINDINGS: The cardiac silhouette, mediastinal and hilar contours are within
normal limits and stable. Stable loop recorder. The lungs are clear.
No pleural effusions. No worrisome pulmonary lesions. The bony
thorax is intact.
IMPRESSION: No acute cardiopulmonary findings.

## 2023-06-01 ENCOUNTER — Ambulatory Visit: Payer: Medicare (Managed Care)

## 2023-06-01 NOTE — Therapy (Signed)
OUTPATIENT PHYSICAL THERAPY LOWER EXTREMITY EVALUATION   Patient Name: Deborah Meyer MRN: [id number] DOB:[birth year], 57 y.o., female Today's Date: 06/01/2023  END OF SESSION:   Past Medical History:  Diagnosis Date   Asthma    COPD (chronic obstructive pulmonary disease) (HCC)    Dyspnea on exertion    Epilepsy with partial complex seizures (HCC)    Heart attack (HCC)    Heart murmur    Hypertension    Low iron    hx of iron supplementation   Migraine    Neuromuscular disorder (HCC)    frequent falls   Neuromuscular disorder (HCC)    Normal cardiac stress test 02/2014   low risk stress echo   Osteoma    craniotomy 2006   Progressive multifocal leukoencephalopathy (HCC)    Sleep apnea    Stroke (HCC)    Thiamin deficiency    Vitamin D deficiency    Past Surgical History:  Procedure Laterality Date   breast tumors removed     CRANIOTOMY     LOOP RECORDER IMPLANT  2017, july   MUSCLE BIOPSY     OSTEOTOMY     TUBAL LIGATION     Patient Active Problem List   Diagnosis Date Noted   Left hip pain 09/20/2018   Epilepsy (HCC) 04/10/2017   Memory loss 12/07/2016   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  Rationale for Evaluation and Treatment: Rehabilitation  ONSET DATE: 2006  SUBJECTIVE:   SUBJECTIVE STATEMENT: Patient reports that she was diagnosed with adult onset muscular dystrophy in 2006. She was also recently diagnosed with Parkinson's disease, bulging discs, and degenerative  disc disease. She feels that her weakness has gotten worse over the years.   PERTINENT HISTORY: *** PAIN:  Are you having pain? Yes: NPRS scale: "severe"/10 Pain location: low back and legs Pain description: sharp, shooting pain  Aggravating factors: movement Relieving factors: medication  PRECAUTIONS: Fall  RED FLAGS: None   WEIGHT BEARING RESTRICTIONS: No  FALLS:  Has patient fallen in last 6 months? Yes. Number of falls 10; most recent fall was last week when she got up to try and reach for her rolling walker  LIVING ENVIRONMENT: Lives with: lives with their spouse and lives with their son Lives in: House/apartment Stairs:  uses the ramp Has following equipment at home: Environmental consultant - 4 wheeled, Wheelchair (manual), shower chair, and Ramped entry  OCCUPATION: disabled  PLOF: Independent with basic ADLs and Independent with household mobility with device  PATIENT GOALS: improved strength and reduced pain  NEXT MD VISIT: January 2025  OBJECTIVE:  Note: Objective measures were completed at Evaluation unless otherwise noted.  COGNITION: Overall cognitive status: Within functional limits for tasks assessed     SENSATION: Patient reports intermittent tingling in both hands, but none currently.  POSTURE: {posture:25561}  PALPATION: ***  LOWER EXTREMITY ROM:  {AROM/PROM:27142} ROM Right eval Left eval  Hip flexion    Hip extension    Hip  abduction    Hip adduction    Hip internal rotation    Hip external rotation    Knee flexion    Knee extension    Ankle dorsiflexion    Ankle plantarflexion    Ankle inversion    Ankle eversion     (Blank rows = not tested)  LOWER EXTREMITY MMT:  MMT Right eval Left eval  Hip flexion 3/5 3/5  Hip extension    Hip abduction    Hip adduction    Hip internal rotation    Hip external rotation    Knee flexion 3/5 3/5  Knee extension 3/5; painful  3/5; painful  Ankle dorsiflexion 3/5 3/5  Ankle plantarflexion    Ankle  inversion    Ankle eversion     (Blank rows = not tested)  LOWER EXTREMITY SPECIAL TESTS:  {LEspecialtests:26242}  FUNCTIONAL TESTS:  {Functional tests:24029}  GAIT: Assistive device utilized: {Assistive devices:23999} Level of assistance: {Levels of assistance:24026} Comments: ***   TODAY'S TREATMENT:                                                                                                                              DATE: ***    PATIENT EDUCATION:  Education details: *** Person educated: {Person educated:25204} Education method: {Education Method:25205} Education comprehension: {Education Comprehension:25206}  HOME EXERCISE PROGRAM: ***  ASSESSMENT:  CLINICAL IMPRESSION: Patient is a 57 y.o. female who was seen today for physical therapy evaluation and treatment for ***.   OBJECTIVE IMPAIRMENTS: {opptimpairments:25111}.   ACTIVITY LIMITATIONS: {activitylimitations:27494}  PARTICIPATION LIMITATIONS: {participationrestrictions:25113}  PERSONAL FACTORS: {Personal factors:25162} are also affecting patient's functional outcome.   REHAB POTENTIAL: {rehabpotential:25112}  CLINICAL DECISION MAKING: {clinical decision making:25114}  EVALUATION COMPLEXITY: {Evaluation complexity:25115}   GOALS: Goals reviewed with patient? {yes/no:20286}  SHORT TERM GOALS: Target date: *** *** Baseline: Goal status: INITIAL  2.  *** Baseline:  Goal status: INITIAL  3.  *** Baseline:  Goal status: INITIAL  4.  *** Baseline:  Goal status: INITIAL  5.  *** Baseline:  Goal status: INITIAL  6.  *** Baseline:  Goal status: INITIAL  LONG TERM GOALS: Target date: ***  *** Baseline:  Goal status: INITIAL  2.  *** Baseline:  Goal status: INITIAL  3.  *** Baseline:  Goal status: INITIAL  4.  *** Baseline:  Goal status: INITIAL  5.  *** Baseline:  Goal status: INITIAL  6.  *** Baseline:  Goal status: INITIAL   PLAN:  PT FREQUENCY: {rehab  frequency:25116}  PT DURATION: {rehab duration:25117}  PLANNED INTERVENTIONS: {rehab planned interventions:25118::"97110-Therapeutic exercises","97530- Therapeutic [id number]- Neuromuscular re-education","97535- Self WNUU","72536- Manual therapy"}  PLAN FOR NEXT SESSION: Granville Lewis, PT 06/01/2023, 12:56 PM

## 2023-06-10 ENCOUNTER — Ambulatory Visit (HOSPITAL_COMMUNITY): Payer: Medicare (Managed Care)

## 2023-10-31 LAB — COLOGUARD

## 2024-07-10 ENCOUNTER — Other Ambulatory Visit: Payer: Self-pay

## 2024-07-10 ENCOUNTER — Ambulatory Visit: Payer: Medicare (Managed Care) | Admitting: Physical Therapy

## 2024-07-10 DIAGNOSIS — M5459 Other low back pain: Secondary | ICD-10-CM | POA: Diagnosis present

## 2024-07-10 DIAGNOSIS — R2681 Unsteadiness on feet: Secondary | ICD-10-CM | POA: Insufficient documentation

## 2024-07-10 DIAGNOSIS — M62830 Muscle spasm of back: Secondary | ICD-10-CM | POA: Diagnosis present

## 2024-07-10 DIAGNOSIS — M6281 Muscle weakness (generalized): Secondary | ICD-10-CM | POA: Diagnosis present

## 2024-07-10 NOTE — Therapy (Signed)
 " OUTPATIENT PHYSICAL THERAPY THORACOLUMBAR EVALUATION   Patient Name: Deborah Meyer MRN: [id number] DOB:March 21, 1966, 58 y.o., female Today's Date: 07/10/2024  END OF SESSION:  PT End of Session - 07/10/24 1329     Visit Number 1    Number of Visits 6    Date for Recertification  08/07/24    PT Start Time 0100    PT Stop Time 0128    PT Time Calculation (min) 28 min    Activity Tolerance Patient tolerated treatment well    Behavior During Therapy [facility] for tasks assessed/performed          Past Medical History:  Diagnosis Date   Asthma    COPD (chronic obstructive pulmonary disease) (HCC)    Dyspnea on exertion    Epilepsy with partial complex seizures (HCC)    Heart attack (HCC)    Heart murmur    Hypertension    Low iron    hx of iron supplementation   Migraine    Neuromuscular disorder (HCC)    frequent falls   Neuromuscular disorder (HCC)    Normal cardiac stress test 02/2014   low risk stress echo   Osteoma    craniotomy 2006   Progressive multifocal leukoencephalopathy (HCC)    Sleep apnea    Stroke (HCC)    Thiamin deficiency    Vitamin D  deficiency    Past Surgical History:  Procedure Laterality Date   breast tumors removed     CRANIOTOMY     LOOP RECORDER IMPLANT  2017, july   MUSCLE BIOPSY     OSTEOTOMY     TUBAL LIGATION     Patient Active Problem List   Diagnosis Date Noted   Left hip pain 09/20/2018   Epilepsy (HCC) 04/10/2017   Memory loss 12/07/2016   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    Rationale for Evaluation and Treatment: Rehabilitation  THERAPY DIAG:  Other low back pain - Plan: PT plan of care cert/re-cert  Muscle spasm of back - Plan: PT plan of care cert/re-cert  Unsteadiness on feet - Plan: PT plan of care cert/re-cert  Muscle weakness (generalized) - Plan: PT plan of care cert/re-cert  ONSET DATE: Ongoing.    SUBJECTIVE:  SUBJECTIVE STATEMENT: The patient presents to the clinic with c/o low back pain rated at 8/10.  Lying flat and taking pain medication helps decrease pain somewhat.  Ant type of movement will increase her pain to a 10/10.  Her pain prevents her from doing things she wants to do such as traveling, visiting friends and cleaning her house.  She reports she is in her wheelchair most of the time.  She uses a walker at home for short distance walking.  She hopes to get a lumbar stimulator in the near future.    PERTINENT HISTORY:  Per patient:  2 strokes.  Please see above.    PAIN:  Are you having pain? Yes: NPRS scale: 8/10.   Pain location: Lumbar spine.   Pain description: Sharp.   Aggravating factors: As above.   Relieving factors: As above.    PRECAUTIONS: Fall  RED FLAGS: None   WEIGHT BEARING RESTRICTIONS: No  FALLS:  Has patient fallen in last 6 months? Yes. Number of falls 6-8/10.    LIVING ENVIRONMENT: Lives with: lives with their spouse Lives in: House/apartment [place]amp.   Has following equipment at home: Vannie - 2 wheeled  OCCUPATION: Disabled.    PLOF: Use wheelchair most of the time which includes when out in community (ie:  shopping.)  PATIENT GOALS: Get a lumbar spinal stimulator.     OBJECTIVE:   PATIENT SURVEYS:  ODI:  32/50.    POSTURE: Right LE with extreme IR, plantar flexed and inverted ankle.     PALPATION: Patient c/o diffuse bilateral lumbar pain and has a great deal of muscle tone.    LUMBAR ROM:   CGA:  Lumbar flexion limited by 50% and extension limited to 10 degrees.    LOWER EXTREMITY ROM:     Right hip ER very limited to -20 degrees from the neutral position.  Seated active right knee extension to -32 degrees.   LOWER EXTREMITY MMT:    Left hip flexion and abduction, right knee ext and flexion 4 to 4+/5 and shaky.  Right ankle strength normal.  Right hip flexion and abduction limited to 2+ to 3-/5, right knee extension 2+/5.  No active right ankle motion.    LUMBAR SPECIAL TESTS:  Not able to accurately assess right SLR test as patient was in too much pain.  FUNCTIONAL TESTS:  5 times sit to stand: 28 seconds.  GAIT: Gait belt donned:  patient walked 20 feet with FWW with right knee partially flexed and right hip IR with right ankle inverted and plantarflexed.    TREATMENT DATE:                                                                                                                                  PATIENT EDUCATION:  Education details:  Person educated:  International aid/development worker:  Education comprehension:   HOME EXERCISE PROGRAM:   ASSESSMENT:  CLINICAL IMPRESSION: The patient presents  to [facility] with c/o lumbar pain that often times reach severe levels the more she moves.  She hope to get a lumbar stimulator to decrease her pain and give her more function.  She states she spends most of the time in a wheelchair.  She walks some around her home with a FWW.  She exhibits extreme right hip internal rotation, a plantarflexed and inverted ankle.  Her right hip and knee are weak and she has no active right ankle motion. She c/o diffuse bilateral lumbar pain with a great deal of lumbar musculature tone.  Her 5 time sit to stand test was performed in 28 seconds and her ODI score is 32/50.    OBJECTIVE IMPAIRMENTS: Abnormal gait, decreased activity tolerance,  decreased balance, decreased coordination, decreased mobility, difficulty walking, decreased ROM, decreased strength, increased muscle spasms, impaired tone, postural dysfunction, and pain.   ACTIVITY LIMITATIONS: carrying, lifting, bending, standing, and locomotion level  PARTICIPATION LIMITATIONS: meal prep, cleaning, laundry, shopping, and community activity  PERSONAL FACTORS: Time since onset of injury/illness/exacerbation and 1 comorbidity: CVA are also affecting patient's functional outcome.   REHAB POTENTIAL: Poor /Poor+  CLINICAL DECISION MAKING: Evolving/moderate complexity  EVALUATION COMPLEXITY: Moderate   GOALS:  SHORT TERM GOALS: Target date: 08/07/24  Ind with a HEP. Goal status: INITIAL  2.  Improve ODI score by 2-3 points. Goal status: INITIAL  PLAN:  PT FREQUENCY/DURATION:  4-6 visits.  PLANNED INTERVENTIONS: 97110-Therapeutic exercises, 97530- Therapeutic activity, [id number]- Neuromuscular re-education, 97535- Self Care, [id number]- Manual therapy, [id number]- Electrical stimulation (unattended), 97035- Ultrasound, Patient/Family education, and Moist heat.  PLAN FOR NEXT SESSION: SKTC, hip bridges.  Modalities and STW/M.     Karleigh Bunte, PT 07/10/2024, 4:08 PM  "

## 2024-07-18 ENCOUNTER — Ambulatory Visit: Payer: Medicare (Managed Care) | Admitting: *Deleted
